# Patient Record
Sex: Male | Born: 1985 | Hispanic: Yes | Marital: Married | Smoking: Current every day smoker
Health system: Southern US, Community
[De-identification: ages and names within clinical notes are randomized; demographics above are authoritative.]

## PROBLEM LIST (undated history)

## (undated) DIAGNOSIS — E785 Hyperlipidemia, unspecified: Secondary | ICD-10-CM

## (undated) DIAGNOSIS — K859 Acute pancreatitis without necrosis or infection, unspecified: Secondary | ICD-10-CM

## (undated) HISTORY — PX: CARDIAC CATHETERIZATION: SHX172

---

## 2017-11-24 ENCOUNTER — Emergency Department (HOSPITAL_COMMUNITY): Payer: Self-pay

## 2017-11-24 ENCOUNTER — Other Ambulatory Visit: Payer: Self-pay

## 2017-11-24 ENCOUNTER — Encounter (HOSPITAL_COMMUNITY): Payer: Self-pay | Admitting: Emergency Medicine

## 2017-11-24 ENCOUNTER — Inpatient Hospital Stay (HOSPITAL_COMMUNITY)
Admission: EM | Admit: 2017-11-24 | Discharge: 2017-11-28 | DRG: 440 | Disposition: A | Discharge status: Home / Self Care | Payer: Self-pay | Attending: Internal Medicine | Admitting: Internal Medicine

## 2017-11-24 DIAGNOSIS — K859 Acute pancreatitis without necrosis or infection, unspecified: Secondary | ICD-10-CM | POA: Diagnosis present

## 2017-11-24 DIAGNOSIS — K861 Other chronic pancreatitis: Secondary | ICD-10-CM | POA: Diagnosis present

## 2017-11-24 DIAGNOSIS — K279 Peptic ulcer, site unspecified, unspecified as acute or chronic, without hemorrhage or perforation: Secondary | ICD-10-CM

## 2017-11-24 DIAGNOSIS — K858 Other acute pancreatitis without necrosis or infection: Principal | ICD-10-CM

## 2017-11-24 DIAGNOSIS — R112 Nausea with vomiting, unspecified: Secondary | ICD-10-CM

## 2017-11-24 DIAGNOSIS — E785 Hyperlipidemia, unspecified: Secondary | ICD-10-CM | POA: Diagnosis present

## 2017-11-24 DIAGNOSIS — R1010 Upper abdominal pain, unspecified: Secondary | ICD-10-CM

## 2017-11-24 DIAGNOSIS — D729 Disorder of white blood cells, unspecified: Secondary | ICD-10-CM

## 2017-11-24 DIAGNOSIS — F17210 Nicotine dependence, cigarettes, uncomplicated: Secondary | ICD-10-CM | POA: Diagnosis present

## 2017-11-24 DIAGNOSIS — B9681 Helicobacter pylori [H. pylori] as the cause of diseases classified elsewhere: Secondary | ICD-10-CM | POA: Diagnosis present

## 2017-11-24 DIAGNOSIS — E781 Pure hyperglyceridemia: Secondary | ICD-10-CM | POA: Diagnosis present

## 2017-11-24 DIAGNOSIS — D473 Essential (hemorrhagic) thrombocythemia: Secondary | ICD-10-CM

## 2017-11-24 DIAGNOSIS — Z23 Encounter for immunization: Secondary | ICD-10-CM

## 2017-11-24 DIAGNOSIS — E162 Hypoglycemia, unspecified: Secondary | ICD-10-CM | POA: Diagnosis not present

## 2017-11-24 DIAGNOSIS — D72828 Other elevated white blood cell count: Secondary | ICD-10-CM

## 2017-11-24 DIAGNOSIS — D75839 Thrombocytosis, unspecified: Secondary | ICD-10-CM

## 2017-11-24 HISTORY — DX: Acute pancreatitis without necrosis or infection, unspecified: K85.90

## 2017-11-24 HISTORY — DX: Hyperlipidemia, unspecified: E78.5

## 2017-11-24 LAB — CBC
HCT: 43.2 % (ref 39.0–52.0)
Hemoglobin: 15.1 g/dL (ref 13.0–17.0)
MCH: 29.2 pg (ref 26.0–34.0)
MCHC: 35 g/dL (ref 30.0–36.0)
MCV: 83.4 fL (ref 78.0–100.0)
PLATELETS: 451 10*3/uL — AB (ref 150–400)
RBC: 5.18 MIL/uL (ref 4.22–5.81)
RDW: 14.4 % (ref 11.5–15.5)
WBC: 14.6 10*3/uL — AB (ref 4.0–10.5)

## 2017-11-24 LAB — COMPREHENSIVE METABOLIC PANEL
ALBUMIN: 3.8 g/dL (ref 3.5–5.0)
ALK PHOS: 48 U/L (ref 38–126)
ALT: 39 U/L (ref 17–63)
AST: 49 U/L — ABNORMAL HIGH (ref 15–41)
Anion gap: 5 (ref 5–15)
BUN: 11 mg/dL (ref 6–20)
CALCIUM: 8.2 mg/dL — AB (ref 8.9–10.3)
CO2: 26 mmol/L (ref 22–32)
Chloride: 101 mmol/L (ref 101–111)
Creatinine, Ser: 0.92 mg/dL (ref 0.61–1.24)
GFR calc Af Amer: >60 mL/min (ref >60)
GFR calc non Af Amer: >60 mL/min (ref >60)
Glucose, Bld: 95 mg/dL (ref 65–99)
Potassium: 4.8 mmol/L (ref 3.5–5.1)
SODIUM: 132 mmol/L — AB (ref 135–145)
Total Bilirubin: 1.2 mg/dL (ref 0.3–1.2)
Total Protein: 5.5 g/dL — ABNORMAL LOW (ref 6.5–8.1)

## 2017-11-24 LAB — DIFFERENTIAL
Band Neutrophils: 0 %
Basophils Absolute: 0.1 10*3/uL (ref 0.0–0.1)
Basophils Relative: 1 %
Blasts: 0 %
EOS ABS: 0.1 10*3/uL (ref 0.0–0.7)
Eosinophils Relative: 1 %
LYMPHS PCT: 17 %
Lymphs Abs: 2.5 10*3/uL (ref 0.7–4.0)
MONOS PCT: 3 %
Metamyelocytes Relative: 0 %
Monocytes Absolute: 0.4 10*3/uL (ref 0.1–1.0)
Myelocytes: 0 %
NEUTROS PCT: 78 %
NRBC: 0 /100{WBCs}
Neutro Abs: 11.5 10*3/uL — ABNORMAL HIGH (ref 1.7–7.7)
Promyelocytes Absolute: 0 %

## 2017-11-24 LAB — URINALYSIS, ROUTINE W REFLEX MICROSCOPIC
Bilirubin Urine: NEGATIVE
GLUCOSE, UA: NEGATIVE mg/dL
HGB URINE DIPSTICK: NEGATIVE
KETONES UR: NEGATIVE mg/dL
LEUKOCYTES UA: NEGATIVE
Nitrite: NEGATIVE
PROTEIN: NEGATIVE mg/dL
Specific Gravity, Urine: 1.024 (ref 1.005–1.030)
pH: 7 (ref 5.0–8.0)

## 2017-11-24 LAB — LIPASE, BLOOD: Lipase: 131 U/L — ABNORMAL HIGH (ref 11–51)

## 2017-11-24 MED ORDER — FAMOTIDINE IN NACL 20-0.9 MG/50ML-% IV SOLN
20.0000 mg | Freq: Once | INTRAVENOUS | Status: AC
  Administered 2017-11-24: 20 mg via INTRAVENOUS
  Filled 2017-11-24: qty 50

## 2017-11-24 MED ORDER — MORPHINE SULFATE (PF) 4 MG/ML IV SOLN
4.0000 mg | Freq: Once | INTRAVENOUS | Status: AC
  Administered 2017-11-24: 4 mg via INTRAVENOUS
  Filled 2017-11-24: qty 1

## 2017-11-24 MED ORDER — SODIUM CHLORIDE 0.9 % IV BOLUS (SEPSIS)
1000.0000 mL | Freq: Once | INTRAVENOUS | Status: AC
  Administered 2017-11-24: 1000 mL via INTRAVENOUS

## 2017-11-24 MED ORDER — IOPAMIDOL (ISOVUE-300) INJECTION 61%
INTRAVENOUS | Status: AC
  Filled 2017-11-24: qty 100

## 2017-11-24 MED ORDER — GI COCKTAIL ~~LOC~~
30.0000 mL | Freq: Once | ORAL | Status: AC
  Administered 2017-11-24: 30 mL via ORAL
  Filled 2017-11-24: qty 30

## 2017-11-24 MED ORDER — ONDANSETRON HCL 4 MG/2ML IJ SOLN
4.0000 mg | Freq: Once | INTRAMUSCULAR | Status: AC
  Administered 2017-11-24: 4 mg via INTRAVENOUS
  Filled 2017-11-24: qty 2

## 2017-11-24 MED ORDER — SODIUM CHLORIDE 0.9 % IJ SOLN
INTRAMUSCULAR | Status: AC
  Administered 2017-11-24: 23:00:00
  Filled 2017-11-24: qty 50

## 2017-11-24 NOTE — ED Provider Notes (Signed)
Medical screening examination/treatment/procedure(s) were conducted as a shared visit with non-physician practitioner(s) and myself.  I personally evaluated the patient during the encounter.   EKG Interpretation None       Results for orders placed or performed during the hospital encounter of 11/24/17  CBC  Result Value Ref Range   WBC 14.6 (H) 4.0 - 10.5 K/uL   RBC 5.18 4.22 - 5.81 MIL/uL   Hemoglobin 15.1 13.0 - 17.0 g/dL   HCT 43.2 39.0 - 52.0 %   MCV 83.4 78.0 - 100.0 fL   MCH 29.2 26.0 - 34.0 pg   MCHC 35.0 30.0 - 36.0 g/dL   RDW 14.4 11.5 - 15.5 %   Platelets 451 (H) 150 - 400 K/uL  Urinalysis, Routine w reflex microscopic  Result Value Ref Range   Color, Urine YELLOW YELLOW   APPearance CLEAR CLEAR   Specific Gravity, Urine 1.024 1.005 - 1.030   pH 7.0 5.0 - 8.0   Glucose, UA NEGATIVE NEGATIVE mg/dL   Hgb urine dipstick NEGATIVE NEGATIVE   Bilirubin Urine NEGATIVE NEGATIVE   Ketones, ur NEGATIVE NEGATIVE mg/dL   Protein, ur NEGATIVE NEGATIVE mg/dL   Nitrite NEGATIVE NEGATIVE   Leukocytes, UA NEGATIVE NEGATIVE  Differential  Result Value Ref Range   Neutrophils Relative % 78 %   Lymphocytes Relative 17 %   Monocytes Relative 3 %   Eosinophils Relative 1 %   Basophils Relative 1 %   Band Neutrophils 0 %   Metamyelocytes Relative 0 %   Myelocytes 0 %   Promyelocytes Absolute 0 %   Blasts 0 %   nRBC 0 0 /100 WBC   Neutro Abs 11.5 (H) 1.7 - 7.7 K/uL   Lymphs Abs 2.5 0.7 - 4.0 K/uL   Monocytes Absolute 0.4 0.1 - 1.0 K/uL   Eosinophils Absolute 0.1 0.0 - 0.7 K/uL   Basophils Absolute 0.1 0.0 - 0.1 K/uL   Smear Review MORPHOLOGY UNREMARKABLE    US Abdomen Complete  Result Date: 11/24/2017 CLINICAL DATA:  Right upper quadrant abdominal pain. EXAM: ABDOMEN ULTRASOUND COMPLETE COMPARISON:  None. FINDINGS: Gallbladder: No gallstones or wall thickening visualized. No sonographic Murphy sign noted by sonographer. Common bile duct: Diameter: 4 mm common normal. Liver:  No focal lesion identified. Within normal limits in parenchymal echogenicity. Portal vein is patent on color Doppler imaging with normal direction of blood flow towards the liver. IVC: No abnormality visualized. Pancreas: Poorly seen because of overlying bowel gas. Spleen: Size and appearance within normal limits. Right Kidney: Length: 12.0 cm. Echogenicity within normal limits. No mass or hydronephrosis visualized. Left Kidney: Length: 10.4 cm. Echogenicity within normal limits. No mass or hydronephrosis visualized. Abdominal aorta: Poorly seen because of bowel gas. No aneurysm suspected. Other findings: No ascites IMPRESSION: No cause of right upper quadrant pain identified. No evidence of hepatic biliary disease. Poor visualization of the pancreas because of overlying bowel gas. Electronically Signed   By: Nelson Chimes M.D.   On: 11/24/2017 20:52    Patient seen by me along with physician assistant.  Patient with a complaint of several weeks of epigastric abdominal pain.  Got worse today.  Patient has a history of pancreatitis.  Has had nausea and vomiting.  Difficulty with his labs probably due to the fact that his triglycerides are high.  This may also be the etiology of the pancreatitis.  Ultrasound right upper quadrant without any specific findings and did not visualize pancreas very well.  We will proceed to do  CT abdomen while were waiting on lipase and liver function tests.  Patient nontoxic no acute distress.  Tenderness to the epigastric area.  Lungs clear bilaterally.  No fevers.  Not tachycardic.  Patient may be able to be treated as an outpatient but most will depend on the CT scan results.   Fredia Sorrow, MD 11/24/17 2219

## 2017-11-24 NOTE — ED Notes (Signed)
Spoke with lab- per Gerald Stabs with lab, light green specimen is too hemolyzed to be ran at [facility]. Sates that if we can obtain a sample that is not hemolyzed that can run the light green regardless if lipenic

## 2017-11-24 NOTE — ED Provider Notes (Signed)
[facility] DEPT Provider Note   CSN: [id number] Arrival date & time: 11/24/17  1201     History   Chief Complaint Chief Complaint  Patient presents with  . Abdominal Pain    HPI Noah Hansen is a 32 y.o. male with a PMHx of HLD, pancreatitis, and recent diagnosis of H.pylori, who presents to the ED with complaints of sudden onset epigastric abdominal pain that began at 6 AM.  Patient has had multiple ED visits and admissions in [facility] (hospital in [place] or [place]?) for pancreatitis.  His last ED evaluation was on 11/16/17 at which time they diagnosed him with H pylori and started him on amoxicillin, clarithromycin, and omeprazole (he is already on pantoprazole and Creon as well).  He describes his pain today as 10/10 constant burning and sharp epigastric pain that radiates into his upper back, worse with sitting upright, improved with laying down, and unrelieved with hydrocodone 7.5-325 mg which was prescribed to him at his most recent ED visit.  He reports associated nausea, 10 episodes of nonbloody nonbilious emesis, and mucus in his stools.  His PCP is Dr. Lovena Le in [facility], and he last had an EGD in November done at the hospital in [facility].  He cannot recall the name of the provider who did the EGD.  He is also on gemfibrozil and niacin for his hyperlipidemia.  He reports occasional naproxen use which was prescribed to him at one of his hospital visits in the recent few weeks.  He has been compliant with all of his prescribed medications.   He denies fevers, chills, CP, SOB, diarrhea/constipation, obstipation, melena, hematochezia, hematemesis, hematuria, dysuria, testicular pain/swelling, penile discharge, myalgias, arthralgias, numbness, tingling, focal weakness, or any other complaints at this time. Denies recent travel, sick contacts, suspicious food intake, EtOH use, or prior abd surgeries.    The history is provided by the patient  and medical records. No language interpreter was used.  Abdominal Pain   This is a recurrent problem. The current episode started 12 to 24 hours ago. The problem occurs constantly. The problem has not changed since onset.The pain is associated with an unknown factor. The pain is located in the epigastric region. The quality of the pain is sharp and burning. The pain is at a severity of 10/10. The pain is moderate. Associated symptoms include nausea and vomiting. Pertinent negatives include fever, diarrhea, flatus, hematochezia, melena, constipation, dysuria, hematuria, arthralgias and myalgias. The symptoms are aggravated by certain positions (sitting up). The symptoms are relieved by recumbency.    Past Medical History:  Diagnosis Date  . Hyperlipemia   . Pancreatitis     There are no active problems to display for this patient.   History reviewed. No pertinent surgical history.     Home Medications    Prior to Admission medications   Not on File    Family History No family history on file.  Social History Social History   Tobacco Use  . Smoking status: Current Every Day Smoker    Packs/day: 0.50    Types: Cigarettes  Substance Use Topics  . Alcohol use: No    Frequency: Never  . Drug use: No     Allergies   Patient has no known allergies.   Review of Systems Review of Systems  Constitutional: Negative for chills and fever.  Respiratory: Negative for shortness of breath.   Cardiovascular: Negative for chest pain.  Gastrointestinal: Positive for abdominal pain, nausea and vomiting.  Negative for blood in stool, constipation, diarrhea, flatus, hematochezia and melena.  Genitourinary: Negative for discharge, dysuria, hematuria, scrotal swelling and testicular pain.  Musculoskeletal: Negative for arthralgias and myalgias.  Skin: Negative for color change.  Allergic/Immunologic: Negative for immunocompromised state.  Neurological: Negative for weakness and numbness.   Psychiatric/Behavioral: Negative for confusion.   All other systems reviewed and are negative for acute change except as noted in the HPI.    Physical Exam Updated Vital Signs BP (!) 128/94 (BP Location: Left Arm)   Pulse 80   Temp 97.7 F (36.5 C) (Oral)   Resp 16   SpO2 100%   Physical Exam  Constitutional: He is oriented to person, place, and time. Vital signs are normal. He appears well-developed and well-nourished.  Non-toxic appearance. No distress.  Afebrile, nontoxic, NAD  HENT:  Head: Normocephalic and atraumatic.  Mouth/Throat: Oropharynx is clear and moist and mucous membranes are normal.  Eyes: Conjunctivae and EOM are normal. Right eye exhibits no discharge. Left eye exhibits no discharge.  Neck: Normal range of motion. Neck supple.  Cardiovascular: Normal rate, regular rhythm, normal heart sounds and intact distal pulses. Exam reveals no gallop and no friction rub.  No murmur heard. Pulmonary/Chest: Effort normal and breath sounds normal. No respiratory distress. He has no decreased breath sounds. He has no wheezes. He has no rhonchi. He has no rales.  Abdominal: Soft. Normal appearance and bowel sounds are normal. He exhibits no distension. There is tenderness in the right upper quadrant and epigastric area. There is guarding and positive Murphy's sign. There is no rigidity, no rebound, no CVA tenderness and no tenderness at McBurney's point.  Soft, nondistended, +BS throughout, with moderate RUQ and epigastric TTP with slight voluntary guarding, no rebound/rigidity, +murphy's, neg mcburney's, no CVA TTP   Musculoskeletal: Normal range of motion.  Neurological: He is alert and oriented to person, place, and time. He has normal strength. No sensory deficit.  Skin: Skin is warm, dry and intact. No rash noted.  Psychiatric: He has a normal mood and affect.  Nursing note and vitals reviewed.    ED Treatments / Results  Labs (all labs ordered are listed, but only  abnormal results are displayed) Labs Reviewed  CBC - Abnormal; Notable for the following components:      Result Value   WBC 14.6 (*)    Platelets 451 (*)    All other components within normal limits  DIFFERENTIAL - Abnormal; Notable for the following components:   Neutro Abs 11.5 (*)    All other components within normal limits  COMPREHENSIVE METABOLIC PANEL - Abnormal; Notable for the following components:   Sodium 132 (*)    Calcium 8.2 (*)    Total Protein 5.5 (*)    AST 49 (*)    All other components within normal limits  LIPASE, BLOOD - Abnormal; Notable for the following components:   Lipase 131 (*)    All other components within normal limits  URINALYSIS, ROUTINE W REFLEX MICROSCOPIC    EKG  EKG Interpretation None       Radiology US Abdomen Complete  Result Date: 11/24/2017 CLINICAL DATA:  Right upper quadrant abdominal pain. EXAM: ABDOMEN ULTRASOUND COMPLETE COMPARISON:  None. FINDINGS: Gallbladder: No gallstones or wall thickening visualized. No sonographic Murphy sign noted by sonographer. Common bile duct: Diameter: 4 mm common normal. Liver: No focal lesion identified. Within normal limits in parenchymal echogenicity. Portal vein is patent on color Doppler imaging with normal direction of blood  flow towards the liver. IVC: No abnormality visualized. Pancreas: Poorly seen because of overlying bowel gas. Spleen: Size and appearance within normal limits. Right Kidney: Length: 12.0 cm. Echogenicity within normal limits. No mass or hydronephrosis visualized. Left Kidney: Length: 10.4 cm. Echogenicity within normal limits. No mass or hydronephrosis visualized. Abdominal aorta: Poorly seen because of bowel gas. No aneurysm suspected. Other findings: No ascites IMPRESSION: No cause of right upper quadrant pain identified. No evidence of hepatic biliary disease. Poor visualization of the pancreas because of overlying bowel gas. Electronically Signed   By: Nelson Chimes M.D.   On:  11/24/2017 20:52    Procedures Procedures (including critical care time)  Medications Ordered in ED Medications  iopamidol (ISOVUE-300) 61 % injection (not administered)  sodium chloride 0.9 % injection (not administered)  morphine 4 MG/ML injection 4 mg (not administered)  morphine 4 MG/ML injection 4 mg (4 mg Intravenous Given 11/24/17 2018)  ondansetron (ZOFRAN) injection 4 mg (4 mg Intravenous Given 11/24/17 2018)  sodium chloride 0.9 % bolus 1,000 mL (0 mLs Intravenous Stopped 11/24/17 2159)  gi cocktail (Maalox,Lidocaine,Donnatal) (30 mLs Oral Given 11/24/17 2018)  famotidine (PEPCID) IVPB 20 mg premix (0 mg Intravenous Stopped 11/24/17 2158)     Initial Impression / Assessment and Plan / ED Course  I have reviewed the triage vital signs and the nursing notes.  Pertinent labs & imaging results that were available during my care of the patient were reviewed by me and considered in my medical decision making (see chart for details).     32 y.o. male here with upper abd pain x13hrs which feels similar to prior pancreatitis episodes; he's been seen by [facility] in the past, was recently diagnosed with H.pylori on 11/16/17 and was started on amoxicillin/clarithromycin/omeprazole in addition to his pantoprazole and creon rx's. On exam, RUQ and epigastric TTP with slight voluntary guarding, +murphy's, no rebound/rigidity, adequate bowel sounds throughout, afebrile and nontoxic. Work up thus far reveals: CBC with mildly elevated WBC 14.6 and plt 451, will add-on differential; U/A unremarkable. CMP and lipase still pending, appear to have gone to main lab at [facility], likely so hyperlipidemic that this has caused interference. Will await these results, add-on differential, and get abd U/S; will giev pain meds, nausea meds, fluids, GI cocktail, and pepcid then reassess shortly.   9:50 PM CMP and lipase still unable to be done because of the fact that after 3 attempts, they've been hemolyzed in  addition to lipemic. Lab sending phlebotomist down to re-draw this so that at least we can try to get it to [facility] to be processed on their machines which can adjust for the lipemic issue. Differential shows mild neutrophilic predominance. Abd U/S unremarkable but unable to visualize pancreas due to overlying bowel gas. Since we don't have the lipase to help [place] determine if this is pancreatitis or not, will proceed with CT abd/pelv to evaluate for pancreatitis. Pt states his pain/nausea is improving. Will reassess after CT abd/pelv and hopefully we'll have the rest of his labs by then too. Discussed case with my attending Dr. Rogene Houston who agrees with plan.    10:45 PM CMP with mildly elevated AST 49 and low Na 132, otherwise essentially unremarkable. Lipase elevated at 131 which confirms suspected pancreatitis, likely from hyperlipidemia. Will cancel CT abd/pelv since now we have the labs to confirm what we suspected. Pain coming back now, will give another round of morphine; nausea still ok. Will proceed with admission for his pancreatitis with  intractable pain. Discussed case with my attending Dr. Rogene Houston who agrees with plan.   11:20 PM Dr. Denton Brick of [facility] returning page and will admit. Holding orders to be placed by admitting team. Please see their notes for further documentation of care. I appreciate their help with this pleasant pt's care. Pt stable at time of admission.    Final Clinical Impressions(s) / ED Diagnoses   Final diagnoses:  Upper abdominal pain  Nausea and vomiting in adult patient  Neutrophilic leukocytosis  Other acute pancreatitis, unspecified complication status  Thrombocytosis [facility])    ED Discharge Orders    [address] [place], [place] 11/24/17 2321    Fredia Sorrow, MD 11/25/17 [phone]

## 2017-11-24 NOTE — ED Triage Notes (Signed)
Pt complaint of mid/left abdominal pain with nausea onset 0600 today; hx of pancreatitis.

## 2017-11-24 NOTE — ED Notes (Signed)
3x lab drawn where the specimen is "too hemolyzed" to be run at [facility] per lab technician. Requested that a main lab personal come to draw the lab and see if that would fix the issue. Main lab will be requested to come draw, per lab technician spoken with.

## 2017-11-25 ENCOUNTER — Encounter (HOSPITAL_COMMUNITY): Payer: Self-pay | Admitting: *Deleted

## 2017-11-25 ENCOUNTER — Other Ambulatory Visit: Payer: Self-pay

## 2017-11-25 DIAGNOSIS — K279 Peptic ulcer, site unspecified, unspecified as acute or chronic, without hemorrhage or perforation: Secondary | ICD-10-CM

## 2017-11-25 DIAGNOSIS — B9681 Helicobacter pylori [H. pylori] as the cause of diseases classified elsewhere: Secondary | ICD-10-CM | POA: Diagnosis present

## 2017-11-25 DIAGNOSIS — K859 Acute pancreatitis without necrosis or infection, unspecified: Secondary | ICD-10-CM | POA: Diagnosis present

## 2017-11-25 DIAGNOSIS — E785 Hyperlipidemia, unspecified: Secondary | ICD-10-CM | POA: Diagnosis present

## 2017-11-25 LAB — LIPID PANEL
Cholesterol: 408 mg/dL — ABNORMAL HIGH (ref 0–200)
Cholesterol: 379 mg/dL — ABNORMAL HIGH (ref 0–200)
LDL CALC: UNDETERMINED mg/dL (ref 0–99)
LDL CALC: UNDETERMINED mg/dL (ref 0–99)
Triglycerides: 2417 mg/dL — ABNORMAL HIGH (ref <150)
Triglycerides: 2015 mg/dL — ABNORMAL HIGH (ref <150)
VLDL: UNDETERMINED mg/dL (ref 0–40)
VLDL: UNDETERMINED mg/dL (ref 0–40)

## 2017-11-25 LAB — COMPREHENSIVE METABOLIC PANEL
ALBUMIN: 3.7 g/dL (ref 3.5–5.0)
ALT: 24 U/L (ref 17–63)
AST: 30 U/L (ref 15–41)
Alkaline Phosphatase: 49 U/L (ref 38–126)
Anion gap: 11 (ref 5–15)
BUN: 10 mg/dL (ref 6–20)
CHLORIDE: 105 mmol/L (ref 101–111)
CO2: 23 mmol/L (ref 22–32)
CREATININE: 0.97 mg/dL (ref 0.61–1.24)
Calcium: 8.5 mg/dL — ABNORMAL LOW (ref 8.9–10.3)
GFR calc Af Amer: >60 mL/min (ref >60)
GLUCOSE: 86 mg/dL (ref 65–99)
Potassium: 4.7 mmol/L (ref 3.5–5.1)
Sodium: 139 mmol/L (ref 135–145)
Total Bilirubin: 1.2 mg/dL (ref 0.3–1.2)
Total Protein: 5.8 g/dL — ABNORMAL LOW (ref 6.5–8.1)

## 2017-11-25 LAB — URINALYSIS, ROUTINE W REFLEX MICROSCOPIC
Bacteria, UA: NONE SEEN
Bilirubin Urine: NEGATIVE
GLUCOSE, UA: NEGATIVE mg/dL
HGB URINE DIPSTICK: NEGATIVE
KETONES UR: NEGATIVE mg/dL
LEUKOCYTES UA: NEGATIVE
NITRITE: NEGATIVE
PH: 6 (ref 5.0–8.0)
PROTEIN: NEGATIVE mg/dL
Specific Gravity, Urine: 1.014 (ref 1.005–1.030)
Squamous Epithelial / LPF: NONE SEEN

## 2017-11-25 LAB — HIV ANTIBODY (ROUTINE TESTING W REFLEX): HIV Screen 4th Generation wRfx: NONREACTIVE

## 2017-11-25 LAB — CBC
HEMATOCRIT: 41.7 % (ref 39.0–52.0)
Hemoglobin: 15 g/dL (ref 13.0–17.0)
MCH: 30 pg (ref 26.0–34.0)
MCHC: 36 g/dL (ref 30.0–36.0)
MCV: 83.4 fL (ref 78.0–100.0)
PLATELETS: 257 10*3/uL (ref 150–400)
RBC: 5 MIL/uL (ref 4.22–5.81)
RDW: 13.3 % (ref 11.5–15.5)
WBC: 8.2 10*3/uL (ref 4.0–10.5)

## 2017-11-25 LAB — GLUCOSE, CAPILLARY
Glucose-Capillary: 88 mg/dL (ref 65–99)
Glucose-Capillary: 92 mg/dL (ref 65–99)
Glucose-Capillary: 90 mg/dL (ref 65–99)

## 2017-11-25 LAB — MRSA PCR SCREENING: MRSA by PCR: NEGATIVE

## 2017-11-25 MED ORDER — DEXTROSE 50 % IV SOLN
25.0000 mL | INTRAVENOUS | Status: DC | PRN
  Filled 2017-11-25: qty 50

## 2017-11-25 MED ORDER — GEMFIBROZIL 600 MG PO TABS
600.0000 mg | ORAL_TABLET | Freq: Two times a day (BID) | ORAL | Status: DC
  Administered 2017-11-25 – 2017-11-28 (×7): 600 mg via ORAL
  Filled 2017-11-25 (×8): qty 1

## 2017-11-25 MED ORDER — AMOXICILLIN 500 MG PO CAPS
500.0000 mg | ORAL_CAPSULE | Freq: Two times a day (BID) | ORAL | Status: DC
  Administered 2017-11-25 – 2017-11-28 (×7): 500 mg via ORAL
  Filled 2017-11-25 (×9): qty 1

## 2017-11-25 MED ORDER — MORPHINE SULFATE (PF) 4 MG/ML IV SOLN
4.0000 mg | INTRAVENOUS | Status: DC | PRN
  Administered 2017-11-25 (×3): 4 mg via INTRAVENOUS
  Filled 2017-11-25 (×4): qty 1

## 2017-11-25 MED ORDER — SODIUM CHLORIDE 0.9 % IV SOLN: INTRAVENOUS | Status: DC

## 2017-11-25 MED ORDER — NICOTINE 21 MG/24HR TD PT24
21.0000 mg | MEDICATED_PATCH | Freq: Every day | TRANSDERMAL | Status: DC
  Administered 2017-11-25 – 2017-11-28 (×4): 21 mg via TRANSDERMAL
  Filled 2017-11-25 (×5): qty 1

## 2017-11-25 MED ORDER — MORPHINE SULFATE (PF) 2 MG/ML IV SOLN
2.0000 mg | INTRAVENOUS | Status: DC | PRN
  Administered 2017-11-25 (×2): 2 mg via INTRAVENOUS
  Filled 2017-11-25: qty 1

## 2017-11-25 MED ORDER — NIACIN 500 MG PO TABS
1500.0000 mg | ORAL_TABLET | Freq: Every day | ORAL | Status: DC
  Administered 2017-11-25 – 2017-11-27 (×3): 1500 mg via ORAL
  Filled 2017-11-25 (×5): qty 3

## 2017-11-25 MED ORDER — SODIUM CHLORIDE 0.9 % IV SOLN
8.0000 [IU]/h | INTRAVENOUS | Status: DC
  Filled 2017-11-25: qty 1

## 2017-11-25 MED ORDER — CLARITHROMYCIN 250 MG PO TABS
500.0000 mg | ORAL_TABLET | Freq: Two times a day (BID) | ORAL | Status: DC
  Administered 2017-11-25 – 2017-11-28 (×7): 500 mg via ORAL
  Filled 2017-11-25 (×9): qty 2

## 2017-11-25 MED ORDER — MORPHINE SULFATE (PF) 4 MG/ML IV SOLN
2.0000 mg | INTRAVENOUS | Status: DC | PRN
  Administered 2017-11-25 – 2017-11-28 (×19): 2 mg via INTRAVENOUS
  Filled 2017-11-25 (×19): qty 1

## 2017-11-25 MED ORDER — PANTOPRAZOLE SODIUM 40 MG IV SOLR
40.0000 mg | Freq: Every day | INTRAVENOUS | Status: DC
  Administered 2017-11-26 – 2017-11-28 (×3): 40 mg via INTRAVENOUS
  Filled 2017-11-25 (×4): qty 40

## 2017-11-25 MED ORDER — MORPHINE SULFATE (PF) 2 MG/ML IV SOLN: 2.0000 mg | INTRAVENOUS | Status: DC | PRN

## 2017-11-25 MED ORDER — DEXTROSE 10 % IV SOLN
INTRAVENOUS | Status: DC
  Administered 2017-11-25 – 2017-11-27 (×4): via INTRAVENOUS
  Filled 2017-11-25 (×3): qty 1000

## 2017-11-25 MED ORDER — SODIUM CHLORIDE 0.9 % IV SOLN
INTRAVENOUS | Status: DC
  Administered 2017-11-25 (×2): via INTRAVENOUS

## 2017-11-25 NOTE — Progress Notes (Signed)
PROGRESS NOTE    Noah Hansen  WUJ:[id number] DOB: November 08, 1985 DOA: 11/24/2017 PCP: Patient, No Pcp Per   Brief Narrative: Patient is a 32 year old male with past medical history of hypertriglyceridemia, recurrent pancreatitis, H Pylori with PUD presented to the emergency department with complains of abdominal pain. Patient was admitted for the management of pancreatitis induced by hypertriglyceridemia.  Patient was taking gemfibrozil at home.   Assessment & Plan:   Principal Problem:   Pancreatitis Active Problems:   HLD (hyperlipidemia)   H pylori ulcer  Pancreatitis: Still complains of abdominal pain.  Continue IV fluids, pain medications. Currently he is n.p.o. IV fluids increased to 200 ml/hr  Hypertriglyceridemia: TG level is more than 2000.  We will start  on insulin drip.  Patient will be moved  to the stepdown. Will  start on concurrent D10. POC glucose should be monitored every hour. Will be very cautious for hypoglycemia. Insulin drip will be continued  Until TG comes below 500. Will repeat TG level every 12 hours  H pylori ulcer-recent diagnosis 11/16/17.   IV Protonix 40 daily Resume home amoxicillin, clarithromycin  Leucocytosis: Resolved.      DVT prophylaxis: SCD Code Status: Full Family Communication:None Disposition Plan: Unknown at this time   Consultants: None  Procedures:None  Antimicrobials:amoxycillin,Clarithromycin  Subjective: Patient was still complaining of abdominal pain  Objective: Vitals:   11/24/17 2300 11/25/17 0500 11/25/17 0630 11/25/17 1402  BP: 124/84  102/62 94/60  Pulse: 76  60 (!) 49  Resp: 18  18 16   Temp: 98.7 F (37.1 C)  97.8 F (36.6 C) 97.9 F (36.6 C)  TempSrc: Oral  Oral Oral  SpO2: 99%  100% 100%  Weight:  86.2 kg (190 lb)    Height:  5\' 9"  (1.753 m)      Intake/Output Summary (Last 24 hours) at 11/25/2017 1533 Last data filed at 11/25/2017 1403 Gross per 24 hour  Intake 1591.67 ml  Output -  Net  1591.67 ml   Filed Weights   11/25/17 0500  Weight: 86.2 kg (190 lb)    Examination:  General exam: Appears calm and comfortable ,Not in obvious distress,average built HEENT:PERRL,Oral mucosa moist, Ears normal on gross exam, Respiratory system: Bilateral equal air entry, normal vesicular breath sounds, no wheezes or crackles  Cardiovascular system: S1 & S2 heard, RRR. No JVD, murmurs, rubs, gallops or clicks. No pedal edema. Gastrointestinal system: Abdomen slightly distended, soft ,generalised tenderness, No organomegaly or masses felt. Normal bowel sounds heard. Central nervous system: Alert and oriented. No focal neurological deficits. Extremities: No edema, no clubbing ,no cyanosis, distal peripheral pulses palpable. Skin: No rashes, lesions or ulcers,no icterus ,no pallor MSK: Normal muscle bulk,tone ,power Psychiatry: Judgement and insight appear normal. Mood & affect appropriate.     Data Reviewed: I have personally reviewed following labs and imaging studies  CBC: Recent Labs  Lab 11/24/17 1339 11/25/17 0515  WBC 14.6* 8.2  NEUTROABS 11.5*  --   HGB 15.1 15.0  HCT 43.2 41.7  MCV 83.4 83.4  PLT 451* 956   Basic Metabolic Panel: Recent Labs  Lab 11/24/17 2153 11/25/17 0515  NA 132* 139  K 4.8 4.7  CL 101 105  CO2 26 23  GLUCOSE 95 86  BUN 11 10  CREATININE 0.92 0.97  CALCIUM 8.2* 8.5*   GFR: Estimated Creatinine Clearance: 117.8 mL/min (by C-G formula based on SCr of 0.97 mg/dL). Liver Function Tests: Recent Labs  Lab 11/24/17 2153 11/25/17 0515  AST 49* 30  ALT 39 24  ALKPHOS 48 49  BILITOT 1.2 1.2  PROT 5.5* 5.8*  ALBUMIN 3.8 3.7   Recent Labs  Lab 11/24/17 2153  LIPASE 131*   No results for input(s): AMMONIA in the last 168 hours. Coagulation Profile: No results for input(s): INR, PROTIME in the last 168 hours. Cardiac Enzymes: No results for input(s): CKTOTAL, CKMB, CKMBINDEX, TROPONINI in the last 168 hours. BNP (last 3  results) No results for input(s): PROBNP in the last 8760 hours. HbA1C: No results for input(s): HGBA1C in the last 72 hours. CBG: No results for input(s): GLUCAP in the last 168 hours. Lipid Profile: Recent Labs    11/25/17 0515  CHOL 408*  HDL NOT REPORTED DUE TO HIGH TRIGLYCERIDES  LDLCALC UNABLE TO CALCULATE IF TRIGLYCERIDE OVER 400 mg/dL  TRIG 2,417*  CHOLHDL NOT REPORTED DUE TO HIGH TRIGLYCERIDES   Thyroid Function Tests: No results for input(s): TSH, T4TOTAL, FREET4, T3FREE, THYROIDAB in the last 72 hours. Anemia Panel: No results for input(s): VITAMINB12, FOLATE, FERRITIN, TIBC, IRON, RETICCTPCT in the last 72 hours. Sepsis Labs: No results for input(s): PROCALCITON, LATICACIDVEN in the last 168 hours.  No results found for this or any previous visit (from the past 240 hour(s)).       Radiology Studies: US Abdomen Complete  Result Date: 11/24/2017 CLINICAL DATA:  Right upper quadrant abdominal pain. EXAM: ABDOMEN ULTRASOUND COMPLETE COMPARISON:  None. FINDINGS: Gallbladder: No gallstones or wall thickening visualized. No sonographic Murphy sign noted by sonographer. Common bile duct: Diameter: 4 mm common normal. Liver: No focal lesion identified. Within normal limits in parenchymal echogenicity. Portal vein is patent on color Doppler imaging with normal direction of blood flow towards the liver. IVC: No abnormality visualized. Pancreas: Poorly seen because of overlying bowel gas. Spleen: Size and appearance within normal limits. Right Kidney: Length: 12.0 cm. Echogenicity within normal limits. No mass or hydronephrosis visualized. Left Kidney: Length: 10.4 cm. Echogenicity within normal limits. No mass or hydronephrosis visualized. Abdominal aorta: Poorly seen because of bowel gas. No aneurysm suspected. Other findings: No ascites IMPRESSION: No cause of right upper quadrant pain identified. No evidence of hepatic biliary disease. Poor visualization of the pancreas because of  overlying bowel gas. Electronically Signed   By: Nelson Chimes M.D.   On: 11/24/2017 20:52        Scheduled Meds: . amoxicillin  500 mg Oral BID  . clarithromycin  500 mg Oral BID  . gemfibrozil  600 mg Oral BID AC  . niacin  1,500 mg Oral QHS  . nicotine  21 mg Transdermal Daily  . pantoprazole (PROTONIX) IV  40 mg Intravenous Daily   Continuous Infusions: . sodium chloride 125 mL/hr at 11/25/17 1014  . dextrose    . insulin (NOVOLIN-R) infusion       LOS: 0 days    Time spent: 25 mins.More than 50% of that time was spent in counseling and/or coordination of care.      Marene Lenz, MD [facility] Pager [phone]  If 7PM-7AM, please contact night-coverage [email or web address] Password [facility] 11/25/2017, 3:33 PM

## 2017-11-25 NOTE — Progress Notes (Signed)
[facility] NP on call X.Blount paged for order clarification for insulin drip for triglycerides management. The current order in Epic is for insulin infusion @ set rate of 8units/hr. However, the other orders in the order set in Epic are for blood sugar checks every hour and Glucostabilizer management of blood sugar. This RN spoke with [facility] NP on call and she states that insulin should be changed based on glucostabilizer software results.  Provider also made aware that patient last blood sugar is only 88. Ordered D10 infusion started to increase blood sugar. Charge RN, Jennye Moccasin reviewed orders and agreed that orders need clarification. Charge RN paged provider on call.

## 2017-11-25 NOTE — Progress Notes (Signed)
[facility] NP on call (X. Blount) notified of last blood sugar result of 90 and glucostabilizer changed insulin infusion rate from 0.3unit/hr to 0 (hold infusion). Verbal order given from provider to increase D10 infusion from 67mL/hr to 152mL/hr. Will continue to monitor blood sugars and adjust insulin based off of glucostabilizer as directed by on call provider.

## 2017-11-25 NOTE — Progress Notes (Signed)
Received report from [place] in ED.   Patient arrived via stretcher, alert and oriented, skin warm and dry.   Complaining of pain at a level of 8, but moved easily from stretcher to bed.  Oriented to room, call bell, tv remote.  No family present at admission.

## 2017-11-25 NOTE — H&P (Addendum)
History and Physical    Dallin Mccorkel FTD:[id number] DOB: 1985/12/23 DOA: 11/24/2017  PCP: Patient, No Pcp Per   Patient coming from: Home   Chief Complaint: Abdominal Pain  HPI: Dontrey Snellgrove is a 32 y.o. male with medical history significant for HLD, pancreatitis, H. Pylori with PUD who presented to the ED with complaints of abdominal pain that started about 6 AM this morning on his way to work, mostly epigastric, described as burning and sharp and radiating to back.  The patient reports at least 10 episodes of vomiting today, nonbloody, some stooling today with mucus, no blood, described as black. Patient denies any alcohol intake.  Patient reports recurrent hospital admission in [place], [place], where he receives most of his care, and where he resides.  He comes to [place] to work.  He is a Nature conservation officer.  Recent hospital admission and discharge from facility in [place], 11/16/17, where he was diagnosed with H. pylori ulcer, and started on antibiotics amoxicillin clarithromycin and omeprazole, to complete 2 weeks, he has 7 more days to go.  Patient reports compliance with these meds, but admits to sometimes forgetting to take his gemfibrozil and niacin, which he has been taking for 9 years, does not feel this is effective as he is constantly being told that his cholesterol is still very high.  Patient reports having abdominal pain for 12 years but was diagnosed with pancreatitis in 2015.  ED Course: Stable vitals.  Blood work was called to obtain because patient's serum was very lipemic. WBC elevated 14.6, sodium mildly low at 132, AST mildly elevated at 49.  Lipase 131.  Right upper quadrant ultrasound no evidence of hepatic biliary disease.  Patient was given 1 L bolus in ED, 4mg  IV morphine., 20mg  pepcid, GI cocktail.  Hospitalist was called to admit for pancreatitis.   Review of Systems: As per HPI otherwise 10 point review of systems negative.   Past Medical History:    Diagnosis Date  . Hyperlipemia   . Pancreatitis    History reviewed. No pertinent surgical history.   reports that he has been smoking cigarettes.  He has been smoking about 0.50 packs per day. He does not have any smokeless tobacco history on file. He reports that he does not drink alcohol or use drugs.  No Known Allergies  Family history not contributory.  Prior to Admission medications   Medication Sig Start Date End Date Taking? Authorizing Provider  amoxicillin (AMOXIL) 500 MG tablet Take 500 mg by mouth 2 (two) times daily.   Yes [provider]  clarithromycin (BIAXIN) 500 MG tablet Take 500 mg by mouth 2 (two) times daily.   Yes [provider]  gemfibrozil (LOPID) 600 MG tablet Take 600 mg by mouth 2 (two) times daily before a meal.   Yes [provider]  HYDROcodone-acetaminophen (NORCO) 7.5-325 MG tablet Take 1 tablet by mouth every 6 (six) hours as needed for moderate pain.   Yes [provider]  niacin 500 MG tablet Take 1,500 mg by mouth at bedtime.   Yes [provider]  omeprazole (PRILOSEC) 20 MG capsule Take 20 mg by mouth daily.   Yes [provider]  pantoprazole (PROTONIX) 40 MG tablet Take 40 mg by mouth daily.   Yes [provider]    Physical Exam: Vitals:   11/24/17 1316 11/24/17 1507 11/24/17 1939 11/24/17 2145  BP: 117/65 (!) 128/94 129/82 125/86  Pulse: 78 80 83 72  Resp: 16 16 18  18  Temp: 97.7 F (36.5 C)  98.4 F (36.9 C)   TempSrc: Oral  Oral   SpO2: 100% 100% 99% 98%    Constitutional: NAD, calm, comfortable, reports feeling significantly better. Vitals:   11/24/17 1316 11/24/17 1507 11/24/17 1939 11/24/17 2145  BP: 117/65 (!) 128/94 129/82 125/86  Pulse: 78 80 83 72  Resp: 16 16 18 18   Temp: 97.7 F (36.5 C)  98.4 F (36.9 C)   TempSrc: Oral  Oral   SpO2: 100% 100% 99% 98%   Eyes: PERRL, lids and conjunctivae normal ENMT: Mucous membranes are moist. Posterior pharynx  clear of any exudate or lesions.Normal dentition.  Neck: normal, supple, no masses, no thyromegaly Respiratory: clear to auscultation bilaterally, no wheezing, no crackles. Normal respiratory effort. No accessory muscle use.  Cardiovascular: Regular rate and rhythm, no murmurs / rubs / gallops. No extremity edema. 2+ pedal pulses. No carotid bruits.  Abdomen: Mild epigastric tenderness, abdomen is soft without guarding, no masses palpated. No hepatosplenomegaly. Bowel sounds positive.  Musculoskeletal: no clubbing / cyanosis. No joint deformity upper and lower extremities. Good ROM, no contractures. Normal muscle tone.  Skin: no rashes, lesions, ulcers. No induration Neurologic: CN 2-12 grossly intact. Strength 5/5 in all 4.  Psychiatric: Normal judgment and insight. Alert and oriented x 3. Normal mood.   Labs on Admission: I have personally reviewed following labs and imaging studies  CBC: Recent Labs  Lab 11/24/17 1339  WBC 14.6*  NEUTROABS 11.5*  HGB 15.1  HCT 43.2  MCV 83.4  PLT 130*   Basic Metabolic Panel: Recent Labs  Lab 11/24/17 2153  NA 132*  K 4.8  CL 101  CO2 26  GLUCOSE 95  BUN 11  CREATININE 0.92  CALCIUM 8.2*   GFR: CrCl cannot be calculated (Unknown ideal weight.). Liver Function Tests: Recent Labs  Lab 11/24/17 2153  AST 49*  ALT 39  ALKPHOS 48  BILITOT 1.2  PROT 5.5*  ALBUMIN 3.8   Recent Labs  Lab 11/24/17 2153  LIPASE 131*   Urine analysis:    Component Value Date/Time   COLORURINE YELLOW 11/24/2017 1319   APPEARANCEUR CLEAR 11/24/2017 1319   LABSPEC 1.024 11/24/2017 1319   PHURINE 7.0 11/24/2017 1319   GLUCOSEU NEGATIVE 11/24/2017 1319   HGBUR NEGATIVE 11/24/2017 1319   BILIRUBINUR NEGATIVE 11/24/2017 1319   KETONESUR NEGATIVE 11/24/2017 1319   PROTEINUR NEGATIVE 11/24/2017 1319   NITRITE NEGATIVE 11/24/2017 1319   LEUKOCYTESUR NEGATIVE 11/24/2017 1319    Radiological Exams on Admission: US Abdomen Complete  Result Date:  11/24/2017 CLINICAL DATA:  Right upper quadrant abdominal pain. EXAM: ABDOMEN ULTRASOUND COMPLETE COMPARISON:  None. FINDINGS: Gallbladder: No gallstones or wall thickening visualized. No sonographic Murphy sign noted by sonographer. Common bile duct: Diameter: 4 mm common normal. Liver: No focal lesion identified. Within normal limits in parenchymal echogenicity. Portal vein is patent on color Doppler imaging with normal direction of blood flow towards the liver. IVC: No abnormality visualized. Pancreas: Poorly seen because of overlying bowel gas. Spleen: Size and appearance within normal limits. Right Kidney: Length: 12.0 cm. Echogenicity within normal limits. No mass or hydronephrosis visualized. Left Kidney: Length: 10.4 cm. Echogenicity within normal limits. No mass or hydronephrosis visualized. Abdominal aorta: Poorly seen because of bowel gas. No aneurysm suspected. Other findings: No ascites IMPRESSION: No cause of right upper quadrant pain identified. No evidence of hepatic biliary disease. Poor visualization of the pancreas because of overlying bowel gas. Electronically Signed   By: Nelson Chimes  M.D.   On: 11/24/2017 20:52   EKG: None.  Assessment/Plan Principal Problem:   Pancreatitis Active Problems:   HLD (hyperlipidemia)   H pylori ulcer   Acute pancreatitis-likely 2/2 hyperlipidemia lipase mildly elevated at 131 (unknown baseline), likely also has chronic pancreatitis.  Possibly PUD contributing. WBC 14.6. Reports significant improvement in ED. No prior abd imaging on file. 1L bolus given in Ed. -Will hold off onCT abd at this time, he reports significant improvement, likely has had multiple imaging studies in the past. -Bowel rest, N.p.o. allow sips, meds - Zofran - morphine 4mg  q4h prn -Continue home Creon - Hydrate Ns 125cc/hr X 1 day  H pylori ulcer-recent diagnosis 11/16/17.  Hemoglobin reassuring at 15.1( unknown baseline). -IV Protonix 40 daily -Resume home amoxicillin,  clarithromycin  HLD-on gemfibrozil and niacin,. -Lipid panel a.m -Cont Home medication, may need more effective therapy.  Leukocytosis- 14.  Likely due to acute on chronic pancreatitis. -Consider abdominal imaging if worsening leukocytosis - CBc a.m - UA  HIV screen as part of routine health maintenance  DVT prophylaxis: Scds with recent diagnosis of PUD Code Status: Full  Family Communication: None at bedside Disposition Plan: 1-2 days Consults called: None  Admission status:Obs, med surg   Bethena Roys MD [facility] Pager [phone]  If 6PM-4AM, please contact night-coverage [email or web address] Password [email or web address]  11/25/2017, 12:50 AM

## 2017-11-26 LAB — GLUCOSE, CAPILLARY
GLUCOSE-CAPILLARY: 171 mg/dL — AB (ref 65–99)
GLUCOSE-CAPILLARY: 117 mg/dL — AB (ref 65–99)
GLUCOSE-CAPILLARY: 109 mg/dL — AB (ref 65–99)
GLUCOSE-CAPILLARY: 114 mg/dL — AB (ref 65–99)
Glucose-Capillary: 101 mg/dL — ABNORMAL HIGH (ref 65–99)
Glucose-Capillary: 104 mg/dL — ABNORMAL HIGH (ref 65–99)
Glucose-Capillary: 105 mg/dL — ABNORMAL HIGH (ref 65–99)
Glucose-Capillary: 112 mg/dL — ABNORMAL HIGH (ref 65–99)
Glucose-Capillary: 112 mg/dL — ABNORMAL HIGH (ref 65–99)
Glucose-Capillary: 114 mg/dL — ABNORMAL HIGH (ref 65–99)
Glucose-Capillary: 116 mg/dL — ABNORMAL HIGH (ref 65–99)
Glucose-Capillary: 116 mg/dL — ABNORMAL HIGH (ref 65–99)
Glucose-Capillary: 118 mg/dL — ABNORMAL HIGH (ref 65–99)
Glucose-Capillary: 122 mg/dL — ABNORMAL HIGH (ref 65–99)
Glucose-Capillary: 136 mg/dL — ABNORMAL HIGH (ref 65–99)
Glucose-Capillary: 45 mg/dL — ABNORMAL LOW (ref 65–99)
Glucose-Capillary: 77 mg/dL (ref 65–99)
Glucose-Capillary: 79 mg/dL (ref 65–99)
Glucose-Capillary: 85 mg/dL (ref 65–99)
Glucose-Capillary: 85 mg/dL (ref 65–99)
Glucose-Capillary: 93 mg/dL (ref 65–99)
Glucose-Capillary: 98 mg/dL (ref 65–99)
Glucose-Capillary: 99 mg/dL (ref 65–99)
Glucose-Capillary: 99 mg/dL (ref 65–99)
Glucose-Capillary: 99 mg/dL (ref 65–99)

## 2017-11-26 LAB — LIPID PANEL
CHOL/HDL RATIO: 17.3 ratio
CHOLESTEROL: 363 mg/dL — AB (ref 0–200)
Cholesterol: 347 mg/dL — ABNORMAL HIGH (ref 0–200)
HDL: 21 mg/dL — ABNORMAL LOW (ref >40)
HDL: 23 mg/dL — ABNORMAL LOW
LDL Cholesterol: UNDETERMINED mg/dL (ref 0–99)
LDL Cholesterol: UNDETERMINED mg/dL (ref 0–99)
TRIGLYCERIDES: 1176 mg/dL — AB (ref <150)
Total CHOL/HDL Ratio: 15.1 ratio
Triglycerides: 914 mg/dL — ABNORMAL HIGH
VLDL: UNDETERMINED mg/dL (ref 0–40)
VLDL: UNDETERMINED mg/dL (ref 0–40)

## 2017-11-26 MED ORDER — DIPHENHYDRAMINE HCL 50 MG/ML IJ SOLN
50.0000 mg | Freq: Four times a day (QID) | INTRAMUSCULAR | Status: AC | PRN
  Administered 2017-11-26 – 2017-11-27 (×2): 50 mg via INTRAVENOUS
  Filled 2017-11-26 (×2): qty 1

## 2017-11-26 MED ORDER — SODIUM CHLORIDE 0.9 % IV SOLN
INTRAVENOUS | Status: DC
  Administered 2017-11-26 – 2017-11-28 (×9): via INTRAVENOUS

## 2017-11-26 MED ORDER — HYDROCORTISONE 1 % EX CREA
1.0000 "application " | TOPICAL_CREAM | Freq: Three times a day (TID) | CUTANEOUS | Status: DC | PRN
  Filled 2017-11-26: qty 28

## 2017-11-26 MED ORDER — INSULIN REGULAR HUMAN 100 UNIT/ML IJ SOLN
2.0000 [IU]/h | INTRAMUSCULAR | Status: DC
  Filled 2017-11-26 (×2): qty 1

## 2017-11-26 MED ORDER — DEXTROSE 50 % IV SOLN
25.0000 mL | Freq: Once | INTRAVENOUS | Status: AC
  Administered 2017-11-26: 25 mL via INTRAVENOUS

## 2017-11-26 MED ORDER — DIPHENHYDRAMINE HCL 25 MG PO CAPS
25.0000 mg | ORAL_CAPSULE | Freq: Four times a day (QID) | ORAL | Status: DC | PRN
  Administered 2017-11-26: 25 mg via ORAL
  Filled 2017-11-26 (×2): qty 1

## 2017-11-26 NOTE — Progress Notes (Signed)
[facility] on call notified of blood sugar and glucostabilizer set rate of insulin infusion to 0 (hold infusion). No response from provider. Will continue to monitor blood sugar.

## 2017-11-26 NOTE — Progress Notes (Signed)
Hypoglycemic Event  CBG: 45 at 0900  Treatment: D50 IV 25 mL, insulin stopped  Symptoms: weak  Follow-up CBG: Time:0915 CBG Result: 79  Possible Reasons for Event: Inadequate meal intake and insulin gtt  Comments/MD notified: Dr. Tawanna Solo walked in room at the time of repeat CBG check. RN instructed by MD to resume insulin gtt at 2 units/hour once CBG reaches a therapeutic level

## 2017-11-26 NOTE — Progress Notes (Signed)
PROGRESS NOTE    Noah Hansen  IRJ:[id number] DOB: 12-07-1985 DOA: 11/24/2017 PCP: Patient, No Pcp Per   Brief Narrative: Patient is a 32 year old male with past medical history of hypertriglyceridemia, recurrent pancreatitis, H Pylori with PUD presented to the emergency department with complains of abdominal pain. Patient was admitted for the management of pancreatitis induced by hypertriglyceridemia.  Patient was taking gemfibrozil at home. Patient has been started on insulin drip for hypertriglyceridemia and has been moved to stepdown unit.  He was also started on D10.   Assessment & Plan:   Principal Problem:   Pancreatitis Active Problems:   HLD (hyperlipidemia)   H pylori ulcer  Pancreatitis: Still complains of abdominal pain.  Continue IV fluids, pain medications. IV fluids increased to 200 ml/hr. We will start on clear liquid diet  Hypertriglyceridemia: TG level is more than 2000.  Started on insulin drip.  Patient  moved  to the stepdown. Started on concurrent D10. POC glucose should be monitored every hour. Will be very cautious for hypoglycemia. Insulin drip will be continued  Until TG comes below 500. Will repeat TG level every 12 hours  H pylori ulcer-recent diagnosis 11/16/17.   IV Protonix 40 daily Resume home amoxicillin, clarithromycin  Leucocytosis: Resolved.    DVT prophylaxis: SCD Code Status: Full Family Communication:None Disposition Plan: Unknown at this time   Consultants: None  Procedures:None  Antimicrobials:amoxycillin,Clarithromycin  Subjective: Patient seen and examined the bedside this morning.  This morning he was little hypoglycemic.  Insulin drip tapered to 2 units/h.  Patient still complains of abdominal pain but looks comfortable.  Objective: Vitals:   11/26/17 0401 11/26/17 0405 11/26/17 0800 11/26/17 0812  BP:  116/78  110/62  Pulse:  (!) 59  72  Resp:  16  18  Temp: (!) 97.4 F (36.3 C)  97.8 F (36.6 C)   TempSrc:  Oral  Oral   SpO2:  99%  100%  Weight:      Height:        Intake/Output Summary (Last 24 hours) at 11/26/2017 1131 Last data filed at 11/26/2017 1100 Gross per 24 hour  Intake 4860.03 ml  Output -  Net 4860.03 ml   Filed Weights   11/25/17 0500  Weight: 86.2 kg (190 lb)    Examination:  General exam: Appears calm and comfortable ,Not in distress,average built Respiratory system: Bilateral equal air entry, normal vesicular breath sounds, no wheezes or crackles  Cardiovascular system: S1 & S2 heard, RRR. No JVD, murmurs, rubs, gallops or clicks.  Gastrointestinal system: Abdomen is nondistended, soft .  Mild generalized tenderness present. no organomegaly or masses felt. Normal bowel sounds heard. Central nervous system: Alert and oriented. No focal neurological deficits. Extremities: No edema, no clubbing ,no cyanosis, distal peripheral pulses palpable. Skin: No cyanosis,No pallor,No Rash,No Ulcer Psychiatry: Judgement and insight appear normal. Mood & affect appropriate.  GU: No Foley   Data Reviewed: I have personally reviewed following labs and imaging studies  CBC: Recent Labs  Lab 11/24/17 1339 11/25/17 0515  WBC 14.6* 8.2  NEUTROABS 11.5*  --   HGB 15.1 15.0  HCT 43.2 41.7  MCV 83.4 83.4  PLT 451* 301   Basic Metabolic Panel: Recent Labs  Lab 11/24/17 2153 11/25/17 0515  NA 132* 139  K 4.8 4.7  CL 101 105  CO2 26 23  GLUCOSE 95 86  BUN 11 10  CREATININE 0.92 0.97  CALCIUM 8.2* 8.5*   GFR: Estimated Creatinine Clearance: 117.8 mL/min (by C-G  formula based on SCr of 0.97 mg/dL). Liver Function Tests: Recent Labs  Lab 11/24/17 2153 11/25/17 0515  AST 49* 30  ALT 39 24  ALKPHOS 48 49  BILITOT 1.2 1.2  PROT 5.5* 5.8*  ALBUMIN 3.8 3.7   Recent Labs  Lab 11/24/17 2153  LIPASE 131*   No results for input(s): AMMONIA in the last 168 hours. Coagulation Profile: No results for input(s): INR, PROTIME in the last 168 hours. Cardiac Enzymes: No  results for input(s): CKTOTAL, CKMB, CKMBINDEX, TROPONINI in the last 168 hours. BNP (last 3 results) No results for input(s): PROBNP in the last 8760 hours. HbA1C: No results for input(s): HGBA1C in the last 72 hours. CBG: Recent Labs  Lab 11/26/17 0809 11/26/17 0858 11/26/17 0919 11/26/17 1018 11/26/17 1117  GLUCAP 122* 45* 79 104* 116*   Lipid Profile: Recent Labs    11/25/17 0515 11/25/17 1508  CHOL 408* 379*  HDL NOT REPORTED DUE TO HIGH TRIGLYCERIDES NOT REPORTED DUE TO HIGH TRIGLYCERIDES  LDLCALC UNABLE TO CALCULATE IF TRIGLYCERIDE OVER 400 mg/dL UNABLE TO CALCULATE IF TRIGLYCERIDE OVER 400 mg/dL  TRIG 2,417* 2,015*  CHOLHDL NOT REPORTED DUE TO HIGH TRIGLYCERIDES NOT REPORTED DUE TO HIGH TRIGLYCERIDES   Thyroid Function Tests: No results for input(s): TSH, T4TOTAL, FREET4, T3FREE, THYROIDAB in the last 72 hours. Anemia Panel: No results for input(s): VITAMINB12, FOLATE, FERRITIN, TIBC, IRON, RETICCTPCT in the last 72 hours. Sepsis Labs: No results for input(s): PROCALCITON, LATICACIDVEN in the last 168 hours.  Recent Results (from the past 240 hour(s))  MRSA PCR Screening     Status: None   Collection Time: 11/25/17  7:16 PM  Result Value Ref Range Status   MRSA by PCR NEGATIVE NEGATIVE Final    Comment:        The GeneXpert MRSA Assay (FDA approved for NASAL specimens only), is one component of a comprehensive MRSA colonization surveillance program. It is not intended to diagnose MRSA infection nor to guide or monitor treatment for MRSA infections. Performed at [facility], [place] [address] [place], [place] [address]          Radiology Studies: US Abdomen Complete  Result Date: 11/24/2017 CLINICAL DATA:  Right upper quadrant abdominal pain. EXAM: ABDOMEN ULTRASOUND COMPLETE COMPARISON:  None. FINDINGS: Gallbladder: No gallstones or wall thickening visualized. No sonographic Murphy sign noted by sonographer. Common bile duct:  Diameter: 4 mm common normal. Liver: No focal lesion identified. Within normal limits in parenchymal echogenicity. Portal vein is patent on color Doppler imaging with normal direction of blood flow towards the liver. IVC: No abnormality visualized. Pancreas: Poorly seen because of overlying bowel gas. Spleen: Size and appearance within normal limits. Right Kidney: Length: 12.0 cm. Echogenicity within normal limits. No mass or hydronephrosis visualized. Left Kidney: Length: 10.4 cm. Echogenicity within normal limits. No mass or hydronephrosis visualized. Abdominal aorta: Poorly seen because of bowel gas. No aneurysm suspected. Other findings: No ascites IMPRESSION: No cause of right upper quadrant pain identified. No evidence of hepatic biliary disease. Poor visualization of the pancreas because of overlying bowel gas. Electronically Signed   By: Nelson Chimes M.D.   On: 11/24/2017 20:52        Scheduled Meds: . amoxicillin  500 mg Oral BID  . clarithromycin  500 mg Oral BID  . gemfibrozil  600 mg Oral BID AC  . niacin  1,500 mg Oral QHS  . nicotine  21 mg Transdermal Daily  . pantoprazole (PROTONIX) IV  40  mg Intravenous Daily   Continuous Infusions: . sodium chloride 200 mL/hr at 11/26/17 0908  . dextrose 125 mL/hr at 11/26/17 0700  . insulin regular infusion 1 unit/mL Stopped (11/26/17 0900)     LOS: 1 day    Time spent: 25 mins.More than 50% of that time was spent in counseling and/or coordination of care.      Marene Lenz, MD [facility] Pager [phone]  If 7PM-7AM, please contact night-coverage [email or web address] Password [email or web address] 11/26/2017, 11:31 AM

## 2017-11-26 NOTE — Progress Notes (Signed)
Dr. Tawanna Solo paged for insulin drip order clarification. This RN explained to MD issues with insulin not infusing overnight based on Glucostabilizer results. MD aware of patient blood sugar levels on D10 @125 . Verbal order given by Dr. Tawanna Solo to change insulin rate to 5units/hr and to check blood sugar in 1hr. Oncoming RN, Ebony Hail aware of order changes.

## 2017-11-27 LAB — LIPID PANEL
CHOL/HDL RATIO: 15.4 ratio
Cholesterol: 292 mg/dL — ABNORMAL HIGH (ref 0–200)
HDL: 19 mg/dL — AB (ref >40)
LDL Cholesterol: UNDETERMINED mg/dL (ref 0–99)
Triglycerides: 690 mg/dL — ABNORMAL HIGH (ref <150)
VLDL: UNDETERMINED mg/dL (ref 0–40)

## 2017-11-27 LAB — GLUCOSE, CAPILLARY
GLUCOSE-CAPILLARY: 144 mg/dL — AB (ref 65–99)
GLUCOSE-CAPILLARY: 73 mg/dL (ref 65–99)
GLUCOSE-CAPILLARY: 94 mg/dL (ref 65–99)
GLUCOSE-CAPILLARY: 99 mg/dL (ref 65–99)
Glucose-Capillary: 115 mg/dL — ABNORMAL HIGH (ref 65–99)
Glucose-Capillary: 119 mg/dL — ABNORMAL HIGH (ref 65–99)
Glucose-Capillary: 133 mg/dL — ABNORMAL HIGH (ref 65–99)
Glucose-Capillary: 155 mg/dL — ABNORMAL HIGH (ref 65–99)
Glucose-Capillary: 77 mg/dL (ref 65–99)
Glucose-Capillary: 84 mg/dL (ref 65–99)
Glucose-Capillary: 89 mg/dL (ref 65–99)

## 2017-11-27 MED ORDER — INFLUENZA VAC SPLIT QUAD 0.5 ML IM SUSY
0.5000 mL | PREFILLED_SYRINGE | INTRAMUSCULAR | Status: AC
  Administered 2017-11-28: 10:00:00 0.5 mL via INTRAMUSCULAR
  Filled 2017-11-27: qty 0.5

## 2017-11-27 NOTE — Progress Notes (Signed)
PROGRESS NOTE    Noah Hansen  VFI:[id number] DOB: Apr 26, 1986 DOA: 11/24/2017 PCP: Patient, No Pcp Per   Brief Narrative: Patient is a 32 year old male with past medical history of hypertriglyceridemia, recurrent pancreatitis, H Pylori with PUD presented to the emergency department with complains of abdominal pain. Patient was admitted for the management of pancreatitis induced by hypertriglyceridemia.  Patient was taking gemfibrozil at home. Patient was started on insulin drip for hypertriglyceridemia and had been moved to stepdown unit.  He was also started on D10. Currently his symptoms have improved.  Triglyceride came down to the range of 600s.  Since patient had recurrent hypoglycemic symptoms while on insulin drip ,will discontinue the drip now.   Assessment & Plan:   Principal Problem:   Pancreatitis Active Problems:   HLD (hyperlipidemia)   H pylori ulcer  Pancreatitis: Abdominal pain has improved.  Continue IV fluids, pain medications. IV fluids at 200 ml/hr. Started on clear liquid diet.We will advance as tolerated  Hypertriglyceridemia: TG level is more than 2000 on presentation.  He was started on insulin drip.  Patient  was moved  to the stepdown. Was also started on concurrent D10. Insulin drip will be discontinued  triglycerides around 600. Will repeat TG level tomorrow AM.  H pylori ulcer:Recent diagnosis 11/16/17.   IV Protonix 40 daily Resumed home amoxicillin, clarithromycin  Leucocytosis: Resolved.  DVT prophylaxis: SCD Code Status: Full Family Communication:None Disposition Plan: Home in 1-2 days  Consultants: None  Procedures:None  Antimicrobials:amoxycillin,Clarithromycin  Subjective: Patient seen and examined the bedside this morning.  Looks more comfortable today but he still complaining of mild epigastric abdominal pain.  Insulin drip discontinued.  Currently tolerating clear liquid diet.  Objective: Vitals:   11/27/17 0110 11/27/17 0356  11/27/17 0400 11/27/17 0800  BP: 133/90  112/73 108/69  Pulse:   75 (!) 59  Resp: 14  15 11   Temp:  97.7 F (36.5 C)  (!) 97.5 F (36.4 C)  TempSrc:  Oral  Oral  SpO2:   95% 98%  Weight:      Height:        Intake/Output Summary (Last 24 hours) at 11/27/2017 1150 Last data filed at 11/27/2017 1000 Gross per 24 hour  Intake 7865.5 ml  Output 6150 ml  Net 1715.5 ml   Filed Weights   11/25/17 0500  Weight: 86.2 kg (190 lb)    Examination:  General exam: Appears calm and comfortable ,Not in distress,average built Respiratory system: Bilateral equal air entry, normal vesicular breath sounds, no wheezes or crackles  Cardiovascular system: S1 & S2 heard, RRR. No JVD, murmurs, rubs, gallops or clicks.  Gastrointestinal system: Abdomen is nondistended, soft .  Mild generalized tenderness present. no organomegaly or masses felt. Normal bowel sounds heard. Central nervous system: Alert and oriented. No focal neurological deficits. Extremities: No edema, no clubbing ,no cyanosis, distal peripheral pulses palpable. Skin: No cyanosis,No pallor,No Rash,No Ulcer Psychiatry: Judgement and insight appear normal. Mood & affect appropriate.  GU: No Foley   Data Reviewed: I have personally reviewed following labs and imaging studies  CBC: Recent Labs  Lab 11/24/17 1339 11/25/17 0515  WBC 14.6* 8.2  NEUTROABS 11.5*  --   HGB 15.1 15.0  HCT 43.2 41.7  MCV 83.4 83.4  PLT 451* 416   Basic Metabolic Panel: Recent Labs  Lab 11/24/17 2153 11/25/17 0515  NA 132* 139  K 4.8 4.7  CL 101 105  CO2 26 23  GLUCOSE 95 86  BUN 11 10  CREATININE 0.92 0.97  CALCIUM 8.2* 8.5*   GFR: Estimated Creatinine Clearance: 117.8 mL/min (by C-G formula based on SCr of 0.97 mg/dL). Liver Function Tests: Recent Labs  Lab 11/24/17 2153 11/25/17 0515  AST 49* 30  ALT 39 24  ALKPHOS 48 49  BILITOT 1.2 1.2  PROT 5.5* 5.8*  ALBUMIN 3.8 3.7   Recent Labs  Lab 11/24/17 2153  LIPASE 131*   No  results for input(s): AMMONIA in the last 168 hours. Coagulation Profile: No results for input(s): INR, PROTIME in the last 168 hours. Cardiac Enzymes: No results for input(s): CKTOTAL, CKMB, CKMBINDEX, TROPONINI in the last 168 hours. BNP (last 3 results) No results for input(s): PROBNP in the last 8760 hours. HbA1C: No results for input(s): HGBA1C in the last 72 hours. CBG: Recent Labs  Lab 11/27/17 0326 11/27/17 0521 11/27/17 0651 11/27/17 0759 11/27/17 0947  GLUCAP 144* 155* 115* 99 77   Lipid Profile: Recent Labs    11/26/17 1924 11/27/17 0814  CHOL 347* 292*  HDL 23* 19*  LDLCALC UNABLE TO CALCULATE IF TRIGLYCERIDE OVER 400 mg/dL UNABLE TO CALCULATE IF TRIGLYCERIDE OVER 400 mg/dL  TRIG 914* 690*  CHOLHDL 15.1 15.4   Thyroid Function Tests: No results for input(s): TSH, T4TOTAL, FREET4, T3FREE, THYROIDAB in the last 72 hours. Anemia Panel: No results for input(s): VITAMINB12, FOLATE, FERRITIN, TIBC, IRON, RETICCTPCT in the last 72 hours. Sepsis Labs: No results for input(s): PROCALCITON, LATICACIDVEN in the last 168 hours.  Recent Results (from the past 240 hour(s))  MRSA PCR Screening     Status: None   Collection Time: 11/25/17  7:16 PM  Result Value Ref Range Status   MRSA by PCR NEGATIVE NEGATIVE Final    Comment:        The GeneXpert MRSA Assay (FDA approved for NASAL specimens only), is one component of a comprehensive MRSA colonization surveillance program. It is not intended to diagnose MRSA infection nor to guide or monitor treatment for MRSA infections. Performed at [facility], [place] [address] [place], [place] [address]          Radiology Studies: No results found.      Scheduled Meds: . amoxicillin  500 mg Oral BID  . clarithromycin  500 mg Oral BID  . gemfibrozil  600 mg Oral BID AC  . [START ON 11/28/2017] Influenza vac split quadrivalent PF  0.5 mL Intramuscular Tomorrow-1000  . niacin  1,500 mg Oral QHS    . nicotine  21 mg Transdermal Daily  . pantoprazole (PROTONIX) IV  40 mg Intravenous Daily   Continuous Infusions: . sodium chloride 200 mL/hr at 11/27/17 1000     LOS: 2 days    Time spent: 25 mins.More than 50% of that time was spent in counseling and/or coordination of care.      Marene Lenz, MD [facility] Pager [phone]  If 7PM-7AM, please contact night-coverage [email or web address] Password [email or web address] 11/27/2017, 11:50 AM

## 2017-11-27 NOTE — Progress Notes (Signed)
Hypoglycemic Event  CBG: 66  Treatment: 15 GM carbohydrate snack  Symptoms: None  Follow-up CBG: [id number] CBG Result:77  Possible Reasons for Event: Inadequate meal intake  Comments/MD notified: Dr. Tawanna Solo notified- will discontinue insulin drip. Will continue to monitor    Mendel Corning

## 2017-11-28 DIAGNOSIS — E781 Pure hyperglyceridemia: Secondary | ICD-10-CM

## 2017-11-28 DIAGNOSIS — K858 Other acute pancreatitis without necrosis or infection: Principal | ICD-10-CM

## 2017-11-28 LAB — GLUCOSE, CAPILLARY
Glucose-Capillary: 91 mg/dL (ref 65–99)
Glucose-Capillary: 100 mg/dL — ABNORMAL HIGH (ref 65–99)
Glucose-Capillary: 97 mg/dL (ref 65–99)

## 2017-11-28 LAB — LIPID PANEL
CHOL/HDL RATIO: 15.4 ratio
CHOLESTEROL: 307 mg/dL — AB (ref 0–200)
HDL: 20 mg/dL — AB (ref >40)
LDL Cholesterol: UNDETERMINED mg/dL (ref 0–99)
TRIGLYCERIDES: 594 mg/dL — AB (ref <150)
VLDL: UNDETERMINED mg/dL (ref 0–40)

## 2017-11-28 MED ORDER — DIPHENHYDRAMINE HCL 25 MG PO CAPS: 25.0000 mg | ORAL_CAPSULE | Freq: Four times a day (QID) | ORAL | 0 refills | Status: AC | PRN

## 2017-11-28 MED ORDER — ACETAMINOPHEN 325 MG PO TABS
650.0000 mg | ORAL_TABLET | Freq: Four times a day (QID) | ORAL | Status: DC | PRN
  Administered 2017-11-28: 650 mg via ORAL
  Filled 2017-11-28: qty 2

## 2017-11-28 NOTE — Discharge Summary (Signed)
Physician Discharge Summary  Noah Hansen OZD:[id number] DOB: [birth year] DOA: 11/24/2017  PCP: Patient, No Pcp Per  Admit date: 11/24/2017 Discharge date: 11/28/2017  Admitted From: Home Disposition:  Home  Discharge Condition: Stable CODE STATUS:Full Diet recommendation: Heart Healthy   Brief/Interim Summary: Patient is a 32 year old male with past medical history of hypertriglyceridemia, recurrent pancreatitis, H Pylori with PUD presented to the emergency department with complains of abdominal pain. Patient was admitted for the management of pancreatitis induced by hypertriglyceridemia.    His triglycerides levels were more than 2000 on presentation .patient was taking gemfibrozil and Niacin at home. Patient was started on insulin drip for hypertriglyceridemia and had been moved to stepdown unit.  He was also started on D10. Currently his symptoms have improved.  Triglyceride came down to the range of 500s.   Patient has tolerated the diet. Patient will be discharged home today.  Following problems were addressed during this hospitalization:  Pancreatitis: Abdominal pain has improved.   We advanced the diet and he tolerated. Pancreatitis associated with hypertriglyceridemia.  He had recurrent admissions in the past with the same problem.  Hypertriglyceridemia: TG level is more than 2000 on presentation.  He was started on insulin drip.  Patient  was moved  to the stepdown. Was also started on concurrent D10. Insulin drip has been discontinued and triglycerides level is in the range of  500.  Hpylori ulcer:Recent diagnosis1/30/19. On Protonix 40 daily Resumed home amoxicillin,clarithromycin.He will continue with these at home to finish the course of 2 weeks.  Leucocytosis: Resolved.    Discharge Diagnoses:  Principal Problem:   Pancreatitis Active Problems:   HLD (hyperlipidemia)   H pylori ulcer    Discharge Instructions  Discharge Instructions    Diet - low  sodium heart healthy   Complete by:  As directed    Discharge instructions   Complete by:  As directed    1) Follow up with your PCP as soon as possible. 2) Check your lipid panel in 3 months to check for your triglyceride level. 3) Please focus on decreasing your weight. Take low fat diet and do regular exercise. 4)Stop smoking or drinking alcohol. 5) Take soft diet only for coming 2-3 days.   Increase activity slowly   Complete by:  As directed      Allergies as of 11/28/2017   No Known Allergies     Medication List    STOP taking these medications   omeprazole 20 MG capsule Commonly known as:  PRILOSEC     TAKE these medications   amoxicillin 500 MG tablet Commonly known as:  AMOXIL Take 500 mg by mouth 2 (two) times daily.   clarithromycin 500 MG tablet Commonly known as:  BIAXIN Take 500 mg by mouth 2 (two) times daily.   diphenhydrAMINE 25 mg capsule Commonly known as:  BENADRYL Take 1 capsule (25 mg total) by mouth every 6 (six) hours as needed for itching.   gemfibrozil 600 MG tablet Commonly known as:  LOPID Take 600 mg by mouth 2 (two) times daily before a meal.   HYDROcodone-acetaminophen 7.5-325 MG tablet Commonly known as:  NORCO Take 1 tablet by mouth every 6 (six) hours as needed for moderate pain.   niacin 500 MG tablet Take 1,500 mg by mouth at bedtime.   pantoprazole 40 MG tablet Commonly known as:  PROTONIX Take 40 mg by mouth daily.       No Known Allergies  Consultations:  None   Procedures/Studies: US Abdomen  Complete  Result Date: 11/24/2017 CLINICAL DATA:  Right upper quadrant abdominal pain. EXAM: ABDOMEN ULTRASOUND COMPLETE COMPARISON:  None. FINDINGS: Gallbladder: No gallstones or wall thickening visualized. No sonographic Murphy sign noted by sonographer. Common bile duct: Diameter: 4 mm common normal. Liver: No focal lesion identified. Within normal limits in parenchymal echogenicity. Portal vein is patent on color Doppler  imaging with normal direction of blood flow towards the liver. IVC: No abnormality visualized. Pancreas: Poorly seen because of overlying bowel gas. Spleen: Size and appearance within normal limits. Right Kidney: Length: 12.0 cm. Echogenicity within normal limits. No mass or hydronephrosis visualized. Left Kidney: Length: 10.4 cm. Echogenicity within normal limits. No mass or hydronephrosis visualized. Abdominal aorta: Poorly seen because of bowel gas. No aneurysm suspected. Other findings: No ascites IMPRESSION: No cause of right upper quadrant pain identified. No evidence of hepatic biliary disease. Poor visualization of the pancreas because of overlying bowel gas. Electronically Signed   By: Nelson Chimes M.D.   On: 11/24/2017 20:52    None   Subjective: Patient seen and examined the bedside this morning.  Remains comfortable.  Abdominal pain has improved.  He tolerated the diet this morning.  Discharge Exam: Vitals:   11/27/17 2102 11/28/17 0500  BP: 131/80 119/85  Pulse: 71 71  Resp: 16 16  Temp: 98.5 F (36.9 C) 98.5 F (36.9 C)  SpO2: 100% 99%   Vitals:   11/27/17 1200 11/27/17 1735 11/27/17 2102 11/28/17 0500  BP: 118/63 138/71 131/80 119/85  Pulse: 74 68 71 71  Resp: 15 15 16 16   Temp: 97.7 F (36.5 C) 99.3 F (37.4 C) 98.5 F (36.9 C) 98.5 F (36.9 C)  TempSrc: Oral Oral Oral Oral  SpO2: 99% 99% 100% 99%  Weight:   83.3 kg (183 lb 10.3 oz)   Height:        General: Pt is alert, awake, not in acute distress Cardiovascular: RRR, S1/S2 +, no rubs, no gallops Respiratory: CTA bilaterally, no wheezing, no rhonchi Abdominal: Soft, NT, ND, bowel sounds + Extremities: no edema, no cyanosis    The results of significant diagnostics from this hospitalization (including imaging, microbiology, ancillary and laboratory) are listed below for reference.     Microbiology: Recent Results (from the past 240 hour(s))  MRSA PCR Screening     Status: None   Collection Time:  11/25/17  7:16 PM  Result Value Ref Range Status   MRSA by PCR NEGATIVE NEGATIVE Final    Comment:        The GeneXpert MRSA Assay (FDA approved for NASAL specimens only), is one component of a comprehensive MRSA colonization surveillance program. It is not intended to diagnose MRSA infection nor to guide or monitor treatment for MRSA infections. Performed at [facility], [place] [address] [place], [place] [address]      Labs: BNP (last 3 results) No results for input(s): BNP in the last 8760 hours. Basic Metabolic Panel: Recent Labs  Lab 11/24/17 2153 11/25/17 0515  NA 132* 139  K 4.8 4.7  CL 101 105  CO2 26 23  GLUCOSE 95 86  BUN 11 10  CREATININE 0.92 0.97  CALCIUM 8.2* 8.5*   Liver Function Tests: Recent Labs  Lab 11/24/17 2153 11/25/17 0515  AST 49* 30  ALT 39 24  ALKPHOS 48 49  BILITOT 1.2 1.2  PROT 5.5* 5.8*  ALBUMIN 3.8 3.7   Recent Labs  Lab 11/24/17 2153  LIPASE 131*   No results for  input(s): AMMONIA in the last 168 hours. CBC: Recent Labs  Lab 11/24/17 1339 11/25/17 0515  WBC 14.6* 8.2  NEUTROABS 11.5*  --   HGB 15.1 15.0  HCT 43.2 41.7  MCV 83.4 83.4  PLT 451* 257   Cardiac Enzymes: No results for input(s): CKTOTAL, CKMB, CKMBINDEX, TROPONINI in the last 168 hours. BNP: Invalid input(s): POCBNP CBG: Recent Labs  Lab 11/27/17 2218 11/27/17 2338 11/28/17 0404 11/28/17 0803 11/28/17 1219  GLUCAP 94 89 91 100* 97   D-Dimer No results for input(s): DDIMER in the last 72 hours. Hgb A1c No results for input(s): HGBA1C in the last 72 hours. Lipid Profile Recent Labs    11/27/17 0814 11/28/17 0525  CHOL 292* 307*  HDL 19* 20*  LDLCALC UNABLE TO CALCULATE IF TRIGLYCERIDE OVER 400 mg/dL UNABLE TO CALCULATE IF TRIGLYCERIDE OVER 400 mg/dL  TRIG 690* 594*  CHOLHDL 15.4 15.4   Thyroid function studies No results for input(s): TSH, T4TOTAL, T3FREE, THYROIDAB in the last 72 hours.  Invalid input(s):  FREET3 Anemia work up No results for input(s): VITAMINB12, FOLATE, FERRITIN, TIBC, IRON, RETICCTPCT in the last 72 hours. Urinalysis    Component Value Date/Time   COLORURINE YELLOW 11/25/2017 0143   APPEARANCEUR CLEAR 11/25/2017 0143   LABSPEC 1.014 11/25/2017 0143   PHURINE 6.0 11/25/2017 0143   GLUCOSEU NEGATIVE 11/25/2017 0143   HGBUR NEGATIVE 11/25/2017 0143   BILIRUBINUR NEGATIVE 11/25/2017 0143   KETONESUR NEGATIVE 11/25/2017 0143   PROTEINUR NEGATIVE 11/25/2017 0143   NITRITE NEGATIVE 11/25/2017 0143   LEUKOCYTESUR NEGATIVE 11/25/2017 0143   Sepsis Labs Invalid input(s): PROCALCITONIN,  WBC,  LACTICIDVEN Microbiology Recent Results (from the past 240 hour(s))  MRSA PCR Screening     Status: None   Collection Time: 11/25/17  7:16 PM  Result Value Ref Range Status   MRSA by PCR NEGATIVE NEGATIVE Final    Comment:        The GeneXpert MRSA Assay (FDA approved for NASAL specimens only), is one component of a comprehensive MRSA colonization surveillance program. It is not intended to diagnose MRSA infection nor to guide or monitor treatment for MRSA infections. Performed at [facility], [place] [address] [place], [place] [address]      Time coordinating discharge: Over 30 minutes  SIGNED:   Marene Lenz, MD  [facility] 11/28/2017, 1:58 PM Pager [phone]  If 7PM-7AM, please contact night-coverage [email or web address] Password [email or web address]

## 2018-04-17 IMAGING — US US ABDOMEN COMPLETE
1 series · 14 of 25 positions shown · non-contrast
Comparison: None.

CLINICAL DATA: Right upper quadrant abdominal pain.

EXAM:
ABDOMEN ULTRASOUND COMPLETE

[Series 1: us abdomen complete · 0.23mm/px · 14 of 84 slices shown]
[im 1/84]
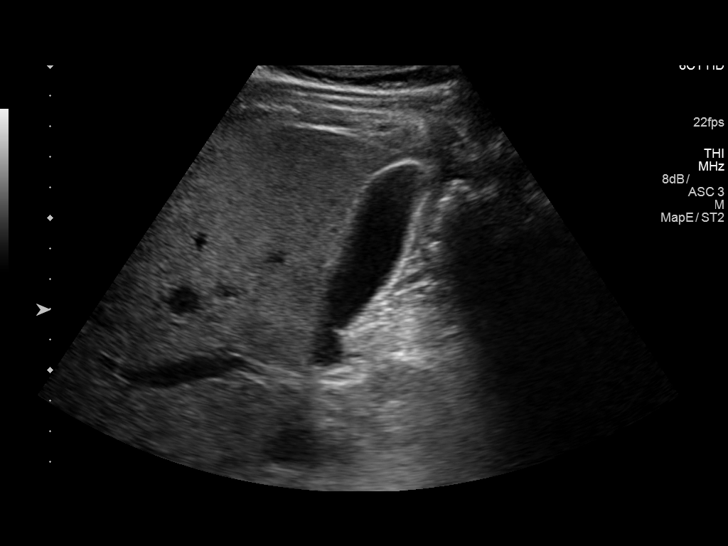
[im 7/84]
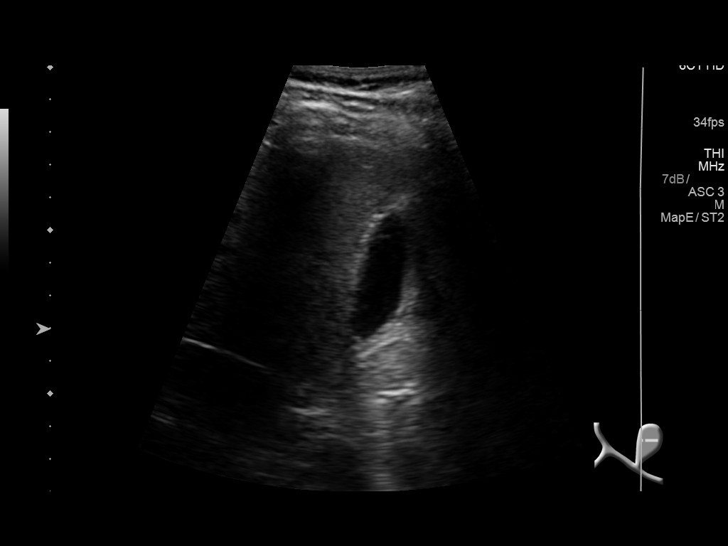
[im 14/84]
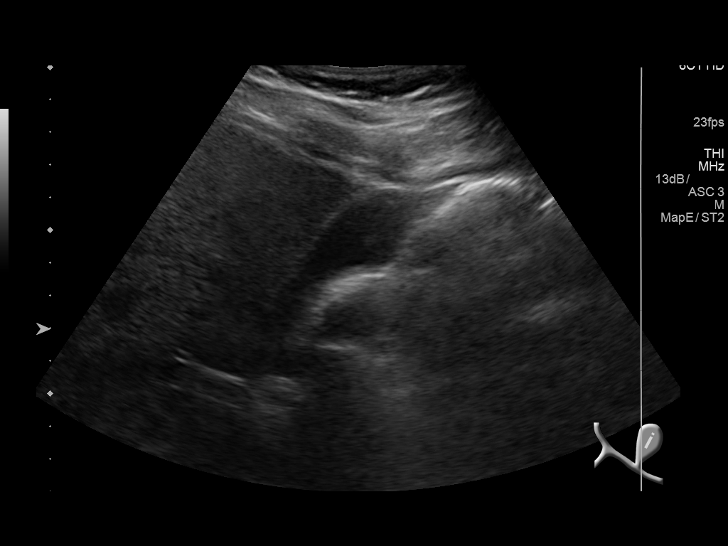
[im 21/84]
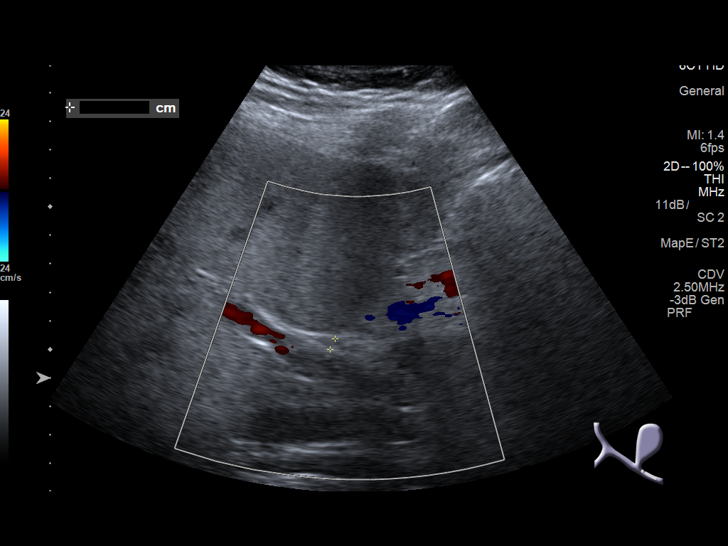
[im 28/84]
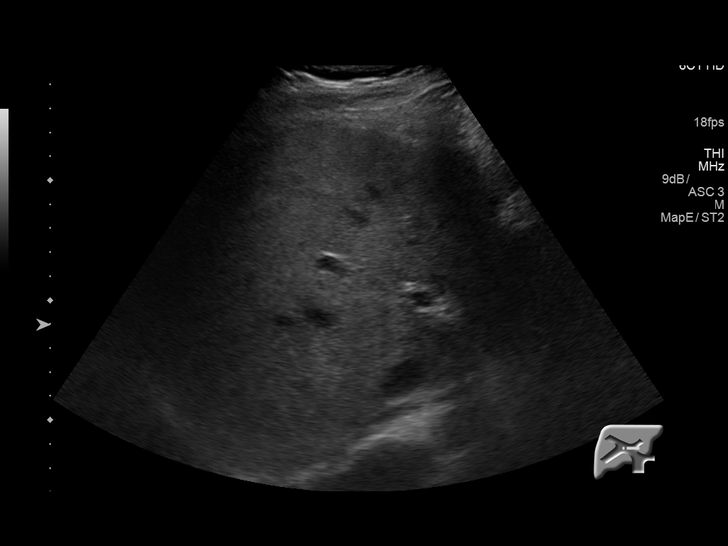
[im 32/84]
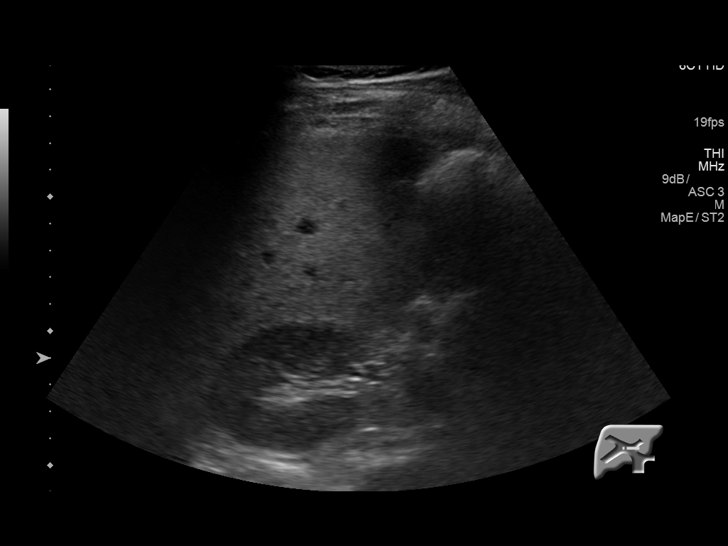
[im 39/84]
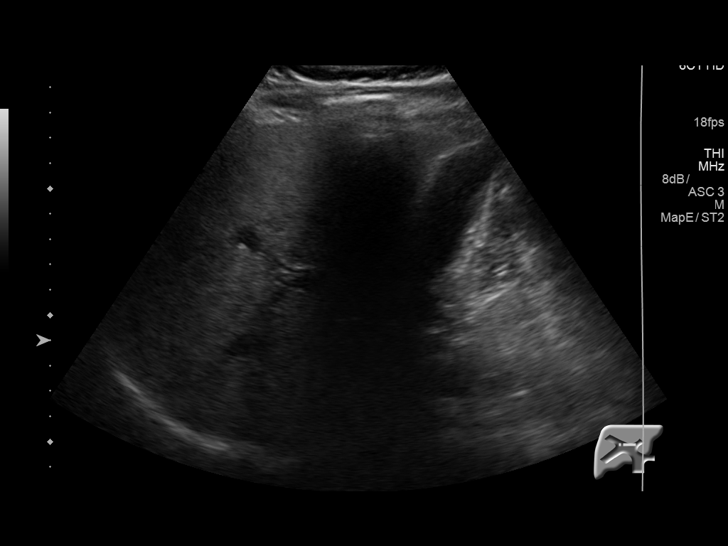
[im 45/84]
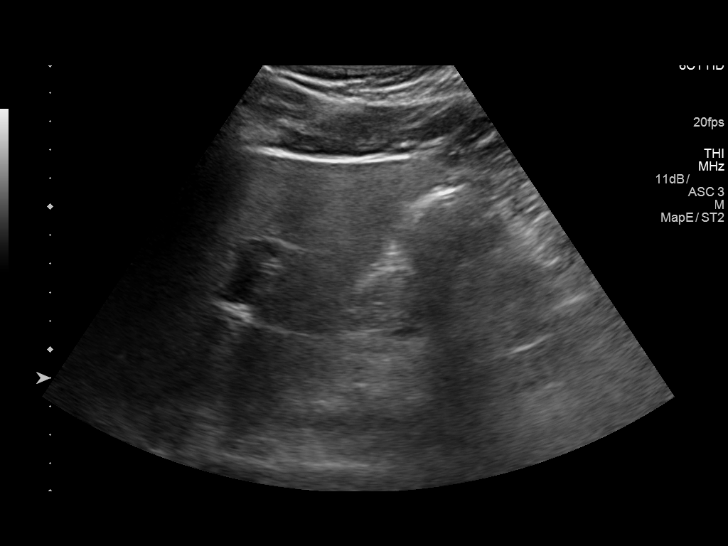
[im 52/84]
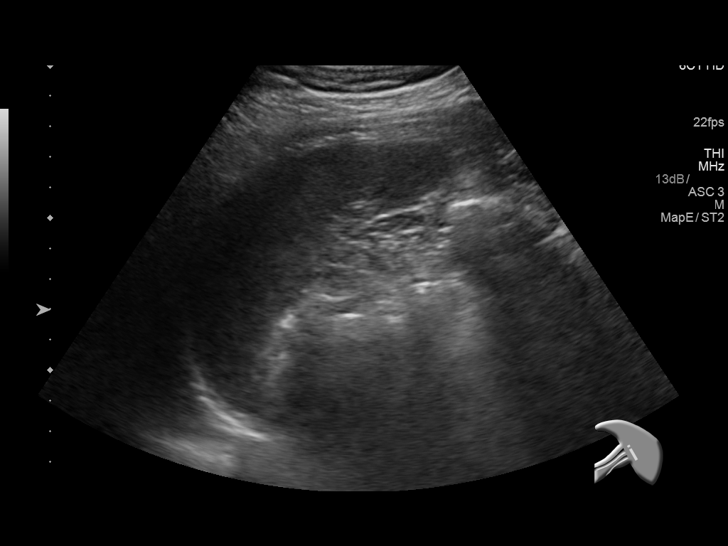
[im 56/84]
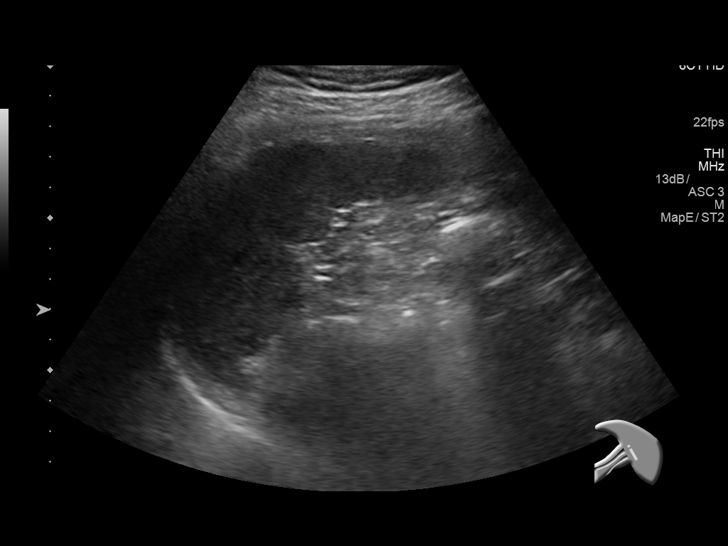
[im 63/84]
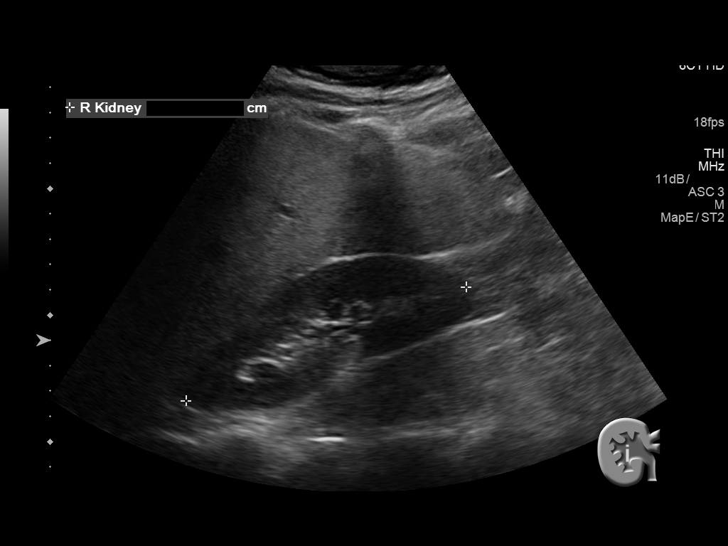
[im 70/84]
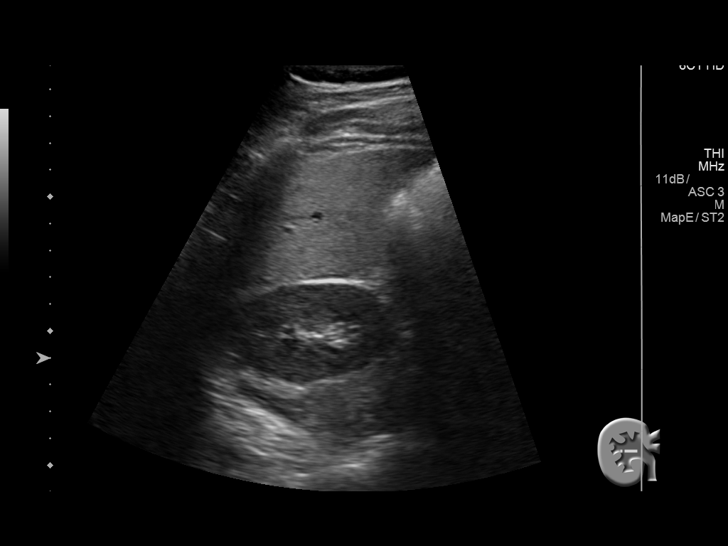
[im 77/84]
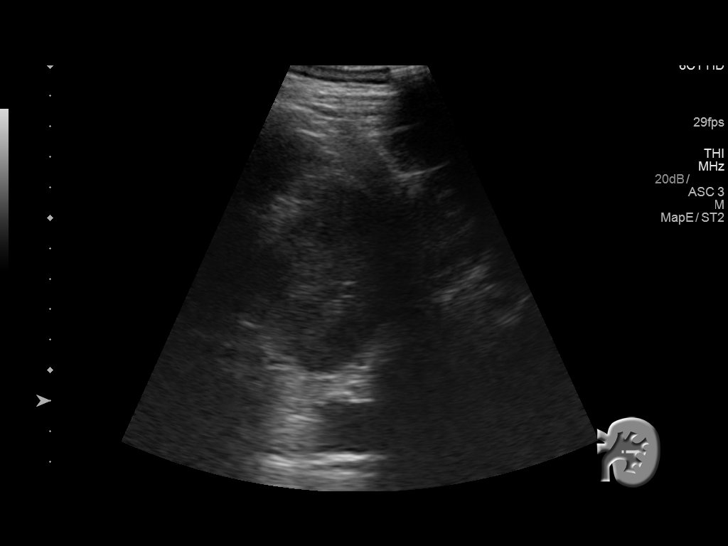
[im 84/84]
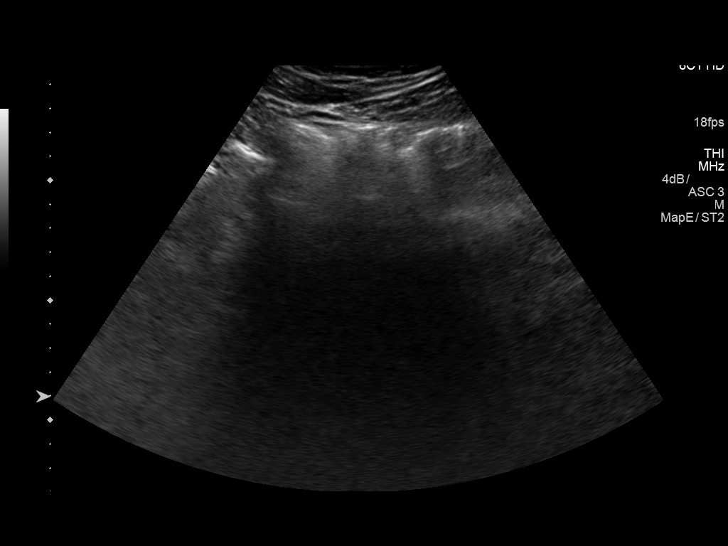

[14 of 25 positions shown; findings below may reference images not displayed]

FINDINGS: Gallbladder: No gallstones or wall thickening visualized. No
sonographic Murphy sign noted by sonographer.

Common bile duct: Diameter: 4 mm common normal.

Liver: No focal lesion identified. Within normal limits in
parenchymal echogenicity. Portal vein is patent on color Doppler
imaging with normal direction of blood flow towards the liver.

IVC: No abnormality visualized.

Pancreas: Poorly seen because of overlying bowel gas.

Spleen: Size and appearance within normal limits.

Right Kidney: Length: 12.0 cm. Echogenicity within normal limits. No
mass or hydronephrosis visualized.

Left Kidney: Length: 10.4 cm. Echogenicity within normal limits. No
mass or hydronephrosis visualized.

Abdominal aorta: Poorly seen because of bowel gas. No aneurysm
suspected.

Other findings: No ascites
IMPRESSION: No cause of right upper quadrant pain identified. No evidence of
hepatic biliary disease. Poor visualization of the pancreas because
of overlying bowel gas.
